# Patient Record
Sex: Female | Born: 1990 | Race: White | Hispanic: No | Marital: Married | Smoking: Never smoker
Health system: Southern US, Community
[De-identification: ages and names within clinical notes are randomized; demographics above are authoritative.]

## PROBLEM LIST (undated history)

## (undated) ENCOUNTER — Inpatient Hospital Stay (HOSPITAL_COMMUNITY): Payer: Self-pay

## (undated) DIAGNOSIS — G5603 Carpal tunnel syndrome, bilateral upper limbs: Secondary | ICD-10-CM

## (undated) DIAGNOSIS — F419 Anxiety disorder, unspecified: Secondary | ICD-10-CM

## (undated) DIAGNOSIS — G5600 Carpal tunnel syndrome, unspecified upper limb: Secondary | ICD-10-CM

## (undated) DIAGNOSIS — K219 Gastro-esophageal reflux disease without esophagitis: Secondary | ICD-10-CM

## (undated) HISTORY — DX: Anxiety disorder, unspecified: F41.9

## (undated) HISTORY — PX: NO PAST SURGERIES: SHX2092

## (undated) HISTORY — DX: Carpal tunnel syndrome, unspecified upper limb: G56.00

---
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
## 2009-02-23 IMAGING — CR DG ANKLE COMPLETE 3+V*L*
3 series · 3 of 3 positions shown · non-contrast
Comparison: none

CLINICAL DATA: Left ankle injury with pain and swelling.
 LEFT ANKLE ? 3 VIEW:

[t ankle joint ap left]
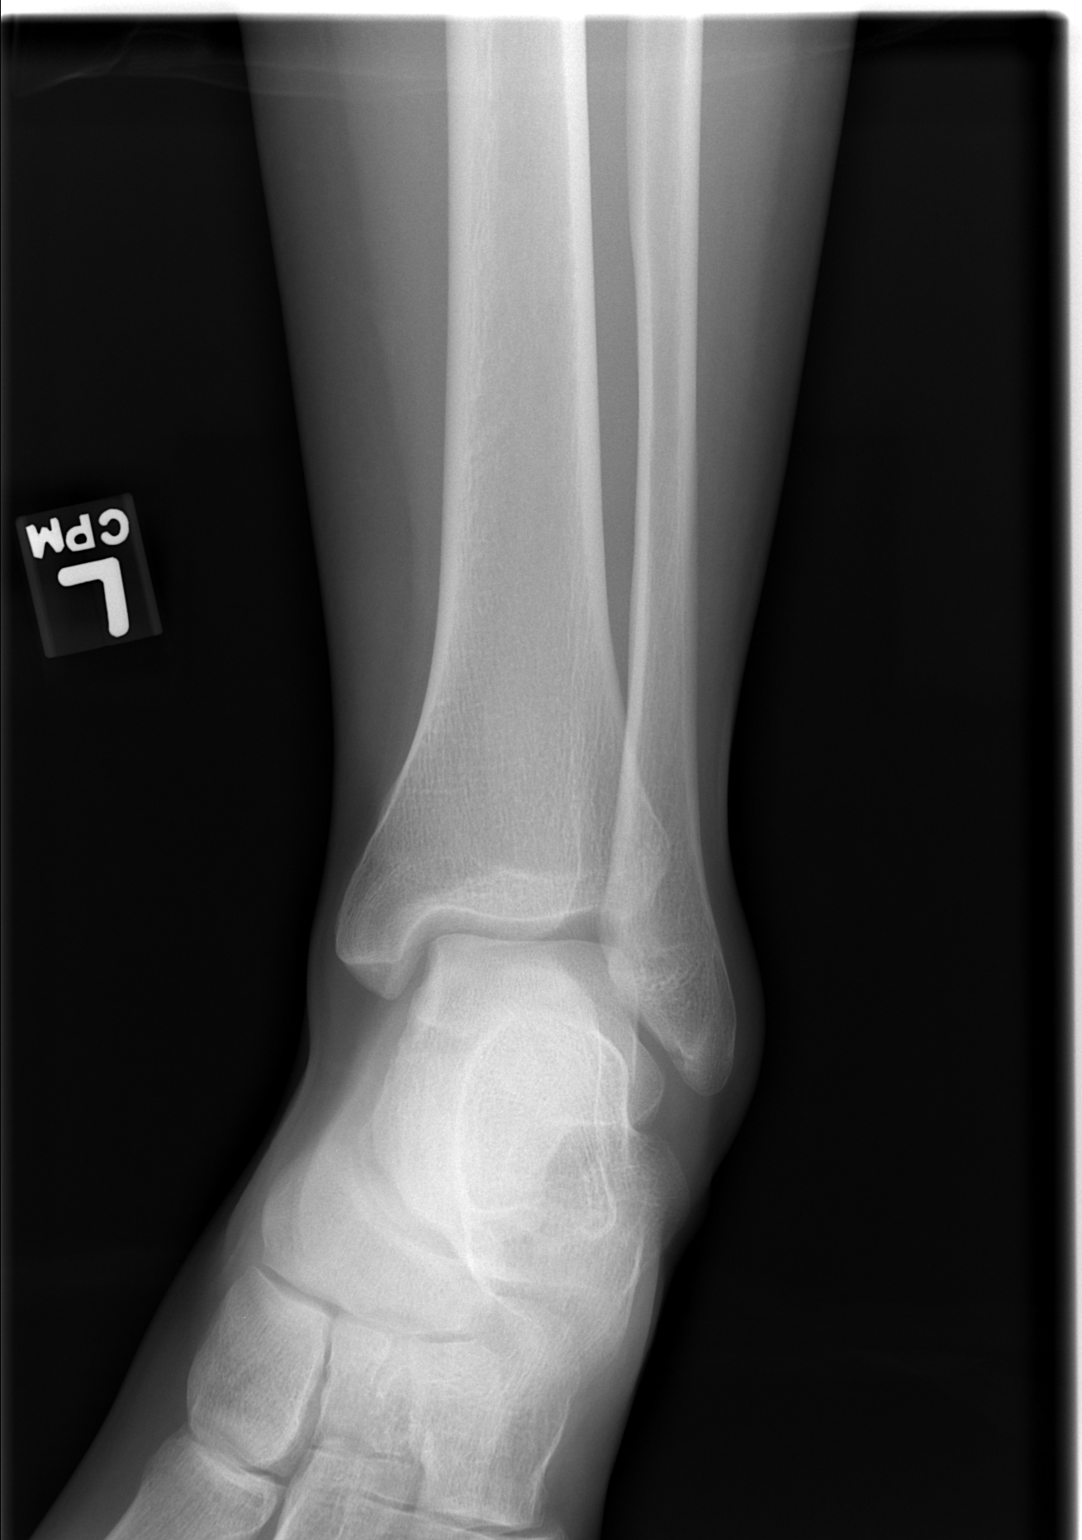

[t ankle joint oblique left]
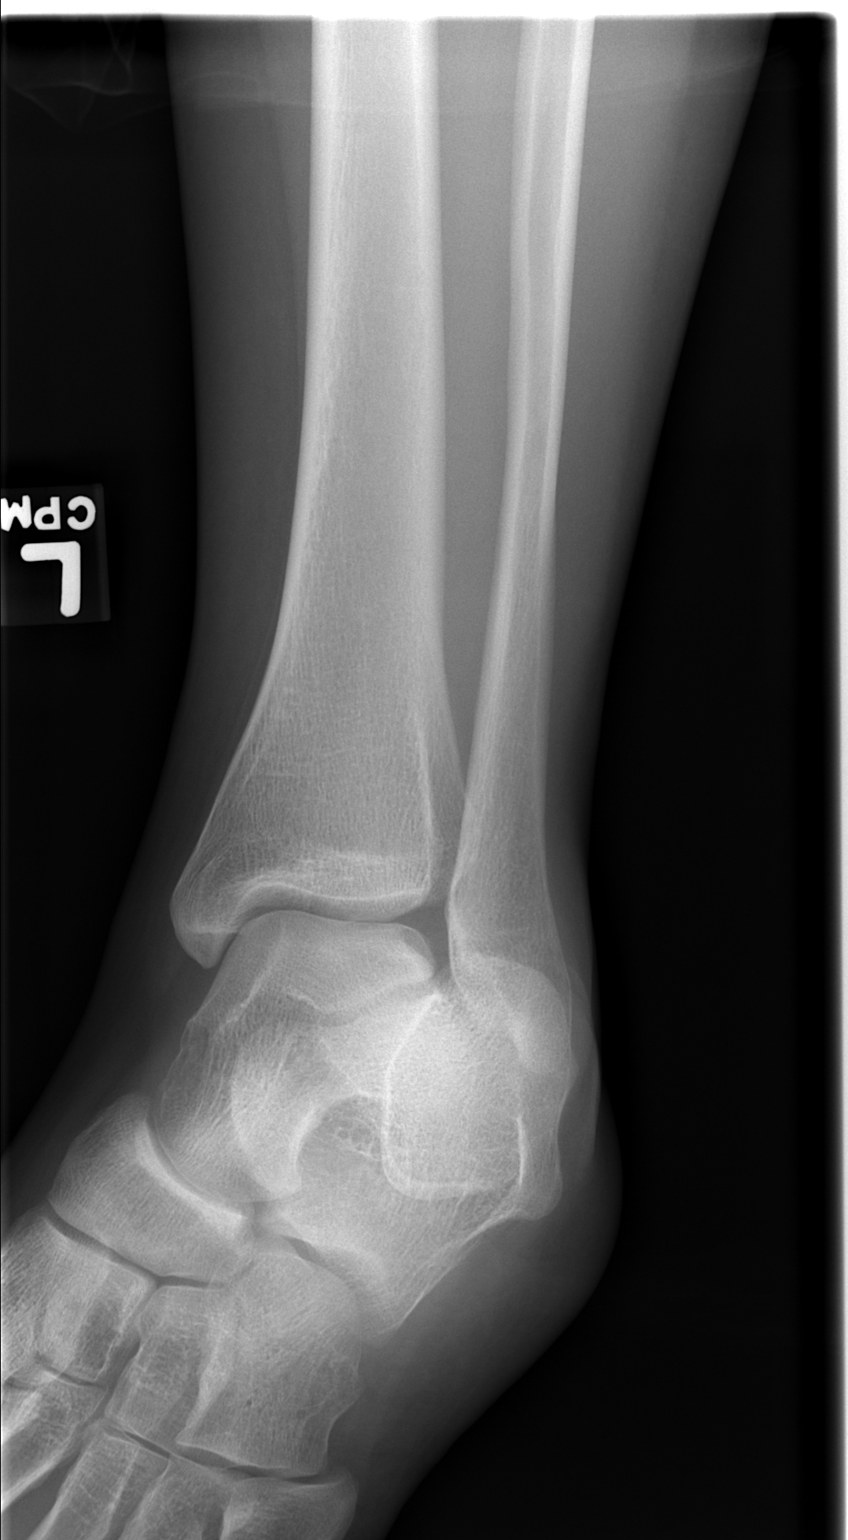

[t ankle joint lat left]
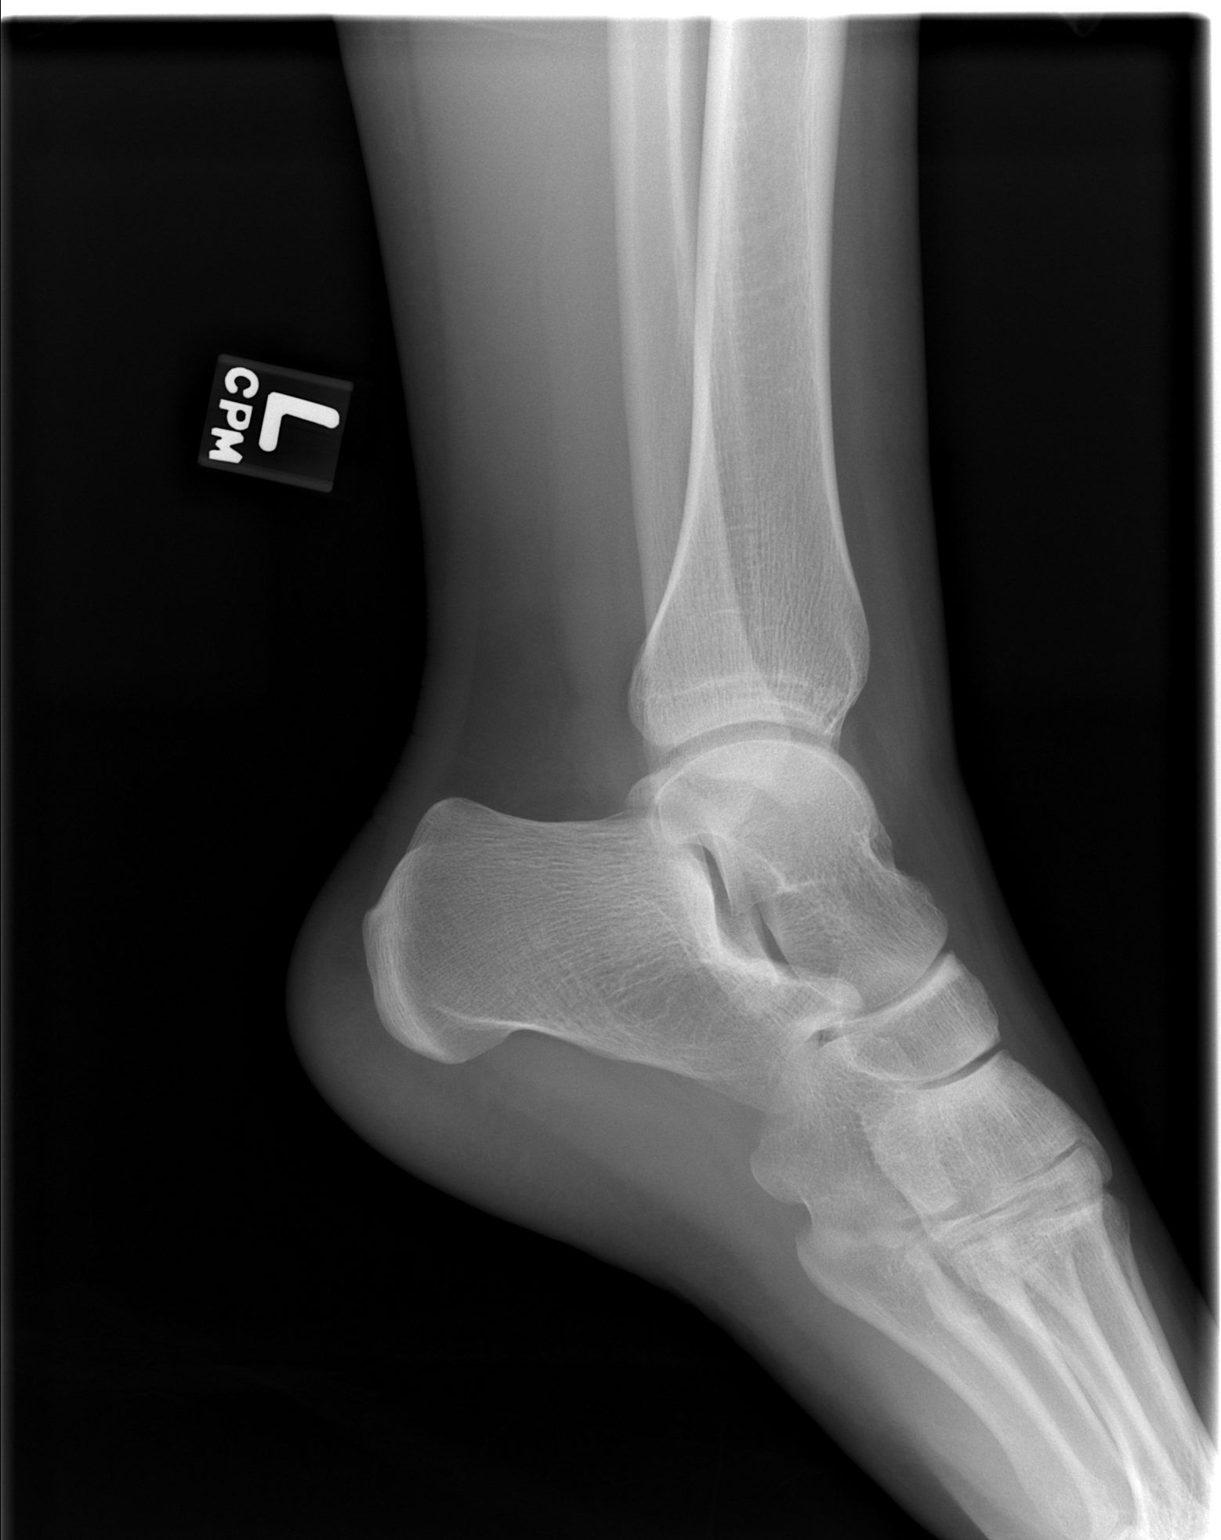

[3 of 3 positions shown; findings below may reference images not displayed]

FINDINGS: There is a nondisplaced fracture of the distal fibular tip.  No evidence of subluxation or dislocation.  Ankle mortise is intact.  Small ankle effusion is present.
IMPRESSION: Nondisplaced fibular tip fracture.

## 2009-03-28 ENCOUNTER — Emergency Department (HOSPITAL_COMMUNITY): Admission: EM | Admit: 2009-03-28 | Discharge: 2009-03-29 | Payer: Self-pay | Admitting: Emergency Medicine

## 2011-11-11 ENCOUNTER — Ambulatory Visit (INDEPENDENT_AMBULATORY_CARE_PROVIDER_SITE_OTHER): Payer: BC Managed Care – PPO | Admitting: Family Medicine

## 2011-11-11 VITALS — BP 132/74 | HR 68 | Temp 98.2°F | Resp 18 | Ht 68.0 in | Wt 154.8 lb

## 2011-11-11 DIAGNOSIS — J329 Chronic sinusitis, unspecified: Secondary | ICD-10-CM

## 2011-11-11 DIAGNOSIS — J019 Acute sinusitis, unspecified: Secondary | ICD-10-CM

## 2011-11-11 DIAGNOSIS — R059 Cough, unspecified: Secondary | ICD-10-CM

## 2011-11-11 DIAGNOSIS — R05 Cough: Secondary | ICD-10-CM

## 2011-11-11 MED ORDER — HYDROCODONE-HOMATROPINE 5-1.5 MG/5ML PO SYRP: 5.0000 mL | ORAL_SOLUTION | Freq: Four times a day (QID) | ORAL | Status: AC | PRN

## 2011-11-11 MED ORDER — AMOXICILLIN-POT CLAVULANATE 875-125 MG PO TABS: 1.0000 | ORAL_TABLET | Freq: Two times a day (BID) | ORAL | Status: AC

## 2011-11-11 NOTE — Progress Notes (Signed)
  Subjective:    Patient ID: Kaitlyn Nguyen, female    DOB: 07-18-1991, 21 y.o.   MRN: [id number]  Cough This is a new problem. The current episode started 1 to 4 weeks ago. The problem has been gradually worsening. The problem occurs constantly. The cough is productive of purulent sputum, productive of sputum and productive of bloody sputum. Associated symptoms include hemoptysis, nasal congestion, postnasal drip and a sore throat. The symptoms are aggravated by nothing. Risk factors for lung disease include occupational exposure. She has tried OTC cough suppressant for the symptoms. The treatment provided no relief. There is no history of asthma, COPD or emphysema.  Sinusitis Associated symptoms include coughing and a sore throat.    Taking Loestrin 24FE Review of Systems  HENT: Positive for sore throat and postnasal drip.   Respiratory: Positive for cough and hemoptysis.        Objective:   Physical Exam  Constitutional: She is oriented to person, place, and time. She appears well-developed and well-nourished.  HENT:  Head: Normocephalic and atraumatic.  Right Ear: External ear normal.  Left Ear: External ear normal.  Mouth/Throat: No oropharyngeal exudate (throat is reddened).  Eyes: Conjunctivae and EOM are normal. Pupils are equal, round, and reactive to light.  Neck: Normal range of motion. Neck supple. No thyromegaly present.  Cardiovascular: Normal rate, regular rhythm, normal heart sounds and intact distal pulses.   Pulmonary/Chest: Wheezes: bilateral ronchi.  Lymphadenopathy:    She has no cervical adenopathy.  Neurological: She is alert and oriented to person, place, and time.  Skin: Skin is warm and dry.          Assessment & Plan:  Acute sinusitis with cough/bronchitis

## 2013-09-06 ENCOUNTER — Encounter: Payer: Self-pay | Admitting: Certified Nurse Midwife

## 2013-09-09 ENCOUNTER — Ambulatory Visit (INDEPENDENT_AMBULATORY_CARE_PROVIDER_SITE_OTHER): Payer: BC Managed Care – PPO | Admitting: Certified Nurse Midwife

## 2013-09-09 ENCOUNTER — Encounter: Payer: Self-pay | Admitting: Certified Nurse Midwife

## 2013-09-09 VITALS — BP 110/78 | HR 72 | Resp 16 | Ht 68.25 in | Wt 150.0 lb

## 2013-09-09 DIAGNOSIS — Z309 Encounter for contraceptive management, unspecified: Secondary | ICD-10-CM

## 2013-09-09 DIAGNOSIS — Z01419 Encounter for gynecological examination (general) (routine) without abnormal findings: Secondary | ICD-10-CM

## 2013-09-09 DIAGNOSIS — N912 Amenorrhea, unspecified: Secondary | ICD-10-CM

## 2013-09-09 LAB — POCT URINE PREGNANCY: Preg Test, Ur: NEGATIVE

## 2013-09-09 MED ORDER — NORETHIN ACE-ETH ESTRAD-FE 1-20 MG-MCG(24) PO CHEW: 1.0000 mg | CHEWABLE_TABLET | Freq: Every day | ORAL | Status: DC

## 2013-09-09 NOTE — Progress Notes (Signed)
22 y.o. G0P0000 Married Caucasian Fe here for annual exam. Periods usually scant with OCP. Happy with choice. Sees PCP prn, treated for URI this year only. No health issues today.  Patient's last menstrual period was 07/03/2012.          Sexually active: yes  The current method of family planning is OCP (estrogen/progesterone).    Exercising: yes  dance Smoker:  no  Health Maintenance: Pap:  07-18-11 neg MMG:  none Colonoscopy:  none BMD:   none TDaP:  2008 Labs: UPT-neg Self breast exam: done monthly   reports that she has never smoked. She does not have any smokeless tobacco history on file. She reports that she drinks alcohol. She reports that she does not use illicit drugs.  Past Medical History  Diagnosis Date  . Carpal tunnel syndrome     History reviewed. No pertinent past surgical history.  Current Outpatient Prescriptions  Medication Sig Dispense Refill  . Norethin Ace-Eth Estrad-FE (MINASTRIN 24 FE) 1-20 MG-MCG(24) CHEW Chew by mouth daily.       No current facility-administered medications for this visit.    Family History  Problem Relation Age of Onset  . Hypertension Father   . Diabetes Maternal Grandfather   . Heart attack Maternal Grandfather   . Heart attack Paternal Grandfather     ROS:  Pertinent items are noted in HPI.  Otherwise, a comprehensive ROS was negative.  Exam:   BP 110/78  Pulse 72  Resp 16  Ht 5' 8.25" (1.734 m)  Wt 150 lb (68.04 kg)  BMI 22.63 kg/m2  LMP 07/03/2012 Height: 5' 8.25" (173.4 cm)  Ht Readings from Last 3 Encounters:  09/09/13 5' 8.25" (1.734 m)  11/11/11 5\' 8"  (1.727 m)    General appearance: alert, cooperative and appears stated age Head: Normocephalic, without obvious abnormality, atraumatic Neck: no adenopathy, supple, symmetrical, trachea midline and thyroid normal to inspection and palpation and non-palpable Lungs: clear to auscultation bilaterally Breasts: normal appearance, no masses or tenderness, No  nipple retraction or dimpling, No nipple discharge or bleeding, No axillary or supraclavicular adenopathy Heart: regular rate and rhythm Abdomen: soft, non-tender; no masses,  no organomegaly Extremities: extremities normal, atraumatic, no cyanosis or edema Skin: Skin color, texture, turgor normal. No rashes or lesions Lymph nodes: Cervical, supraclavicular, and axillary nodes normal. No abnormal inguinal nodes palpated Neurologic: Grossly normal   Pelvic: External genitalia:  no lesions              Urethra:  normal appearing urethra with no masses, tenderness or lesions              Bartholin's and Skene's: normal                 Vagina: normal appearing vagina with normal color and discharge, no lesions              Cervix: normal, non tender              Pap taken: no Bimanual Exam:  Uterus:  normal size, contour, position, consistency, mobility, non-tender and anteverted              Adnexa: normal adnexa and no mass, fullness, tenderness               Rectovaginal: Confirms               Anus: deferred  A:  Well Woman with normal exam  Contraception OCP desired  P:   Reviewed  health and wellness pertinent to exam  Rx Minastrin 24 Fe see order  Pap smear as per guidelines   pap smear not taken today  counseled on use and side effects of OCP's, adequate intake of calcium and vitamin D, diet and exercise  return annually or prn  An After Visit Summary was printed and given to the patient.

## 2013-09-09 NOTE — Patient Instructions (Signed)
General topics  Next pap or exam is  due in 1 year Take a Women's multivitamin Take 1200 mg. of calcium daily - prefer dietary If any concerns in interim to call back  Breast Self-Awareness Practicing breast self-awareness may pick up problems early, prevent significant medical complications, and possibly save your life. By practicing breast self-awareness, you can become familiar with how your breasts look and feel and if your breasts are changing. This allows you to notice changes early. It can also offer you some reassurance that your breast health is good. One way to learn what is normal for your breasts and whether your breasts are changing is to do a breast self-exam. If you find a lump or something that was not present in the past, it is best to contact your caregiver right away. Other findings that should be evaluated by your caregiver include nipple discharge, especially if it is bloody; skin changes or reddening; areas where the skin seems to be pulled in (retracted); or new lumps and bumps. Breast pain is seldom associated with cancer (malignancy), but should also be evaluated by a caregiver. BREAST SELF-EXAM The best time to examine your breasts is 5 7 days after your menstrual period is over.  ExitCare Patient Information 2013 [employer].   Exercise to Stay Healthy Exercise helps you become and stay healthy. EXERCISE IDEAS AND TIPS Choose exercises that:  You enjoy.  Fit into your day. You do not need to exercise really hard to be healthy. You can do exercises at a slow or medium level and stay healthy. You can:  Stretch before and after working out.  Try yoga, Pilates, or tai chi.  Lift weights.  Walk fast, swim, jog, run, climb stairs, bicycle, dance, or rollerskate.  Take aerobic classes. Exercises that burn about 150 calories:  Running 1  miles in 15 minutes.  Playing volleyball for 45 to 60 minutes.  Washing and waxing a car for 45 to 60  minutes.  Playing touch football for 45 minutes.  Walking 1  miles in 35 minutes.  Pushing a stroller 1  miles in 30 minutes.  Playing basketball for 30 minutes.  Raking leaves for 30 minutes.  Bicycling 5 miles in 30 minutes.  Walking 2 miles in 30 minutes.  Dancing for 30 minutes.  Shoveling snow for 15 minutes.  Swimming laps for 20 minutes.  Walking up stairs for 15 minutes.  Bicycling 4 miles in 15 minutes.  Gardening for 30 to 45 minutes.  Jumping rope for 15 minutes.  Washing windows or floors for 45 to 60 minutes. Document Released: 10/22/2010 Document Revised: 12/12/2011 Document Reviewed: 10/22/2010 Exit[employer] Patient Information 2013 [employer].   Other topics ( that may be useful information):    Sexually Transmitted Disease Sexually transmitted disease (STD) refers to any infection that is passed from person to person during sexual activity. This may happen by way of saliva, semen, blood, vaginal mucus, or urine. Common STDs include:  Gonorrhea.  Chlamydia.  Syphilis.  HIV/AIDS.  Genital herpes.  Hepatitis B and C.  Trichomonas.  Human papillomavirus (HPV).  Pubic lice. CAUSES  An STD may be spread by bacteria, virus, or parasite. A person can get an STD by:  Sexual intercourse with an infected person.  Sharing sex toys with an infected person.  Sharing needles with an infected person.  Having intimate contact with the genitals, mouth, or rectal areas of an infected person. SYMPTOMS  Some people may not have any symptoms, but   they can still pass the infection to others. Different STDs have different symptoms. Symptoms include:  Painful or bloody urination.  Pain in the pelvis, abdomen, vagina, anus, throat, or eyes.  Skin rash, itching, irritation, growths, or sores (lesions). These usually occur in the genital or anal area.  Abnormal vaginal discharge.  Penile discharge in men.  Soft, flesh-colored skin growths in the  genital or anal area.  Fever.  Pain or bleeding during sexual intercourse.  Swollen glands in the groin area.  Yellow skin and eyes (jaundice). This is seen with hepatitis. DIAGNOSIS  To make a diagnosis, your caregiver may:  Take a medical history.  Perform a physical exam.  Take a specimen (culture) to be examined.  Examine a sample of discharge under a microscope.  Perform blood test TREATMENT   Chlamydia, gonorrhea, trichomonas, and syphilis can be cured with antibiotic medicine.  Genital herpes, hepatitis, and HIV can be treated, but not cured, with prescribed medicines. The medicines will lessen the symptoms.  Genital warts from HPV can be treated with medicine or by freezing, burning (electrocautery), or surgery. Warts may come back.  HPV is a virus and cannot be cured with medicine or surgery.However, abnormal areas may be followed very closely by your caregiver and may be removed from the cervix, vagina, or vulva through office procedures or surgery. If your diagnosis is confirmed, your recent sexual partners need treatment. This is true even if they are symptom-free or have a negative culture or evaluation. They should not have sex until their caregiver says it is okay. HOME CARE INSTRUCTIONS  All sexual partners should be informed, tested, and treated for all STDs.  Take your antibiotics as directed. Finish them even if you start to feel better.  Only take over-the-counter or prescription medicines for pain, discomfort, or fever as directed by your caregiver.  Rest.  Eat a balanced diet and drink enough fluids to keep your urine clear or pale yellow.  Do not have sex until treatment is completed and you have followed up with your caregiver. STDs should be checked after treatment.  Keep all follow-up appointments, Pap tests, and blood tests as directed by your caregiver.  Only use latex condoms and water-soluble lubricants during sexual activity. Do not use  petroleum jelly or oils.  Avoid alcohol and illegal drugs.  Get vaccinated for HPV and hepatitis. If you have not received these vaccines in the past, talk to your caregiver about whether one or both might be right for you.  Avoid risky sex practices that can break the skin. The only way to avoid getting an STD is to avoid all sexual activity.Latex condoms and dental dams (for oral sex) will help lessen the risk of getting an STD, but will not completely eliminate the risk. SEEK MEDICAL CARE IF:   You have a fever.  You have any new or worsening symptoms. Document Released: 12/10/2002 Document Revised: 12/12/2011 Document Reviewed: 12/17/2010 ExitCare Patient Information 2013 [employer].    Domestic Abuse You are being battered or abused if someone close to you hits, pushes, or physically hurts you in any way. You also are being abused if you are forced into activities. You are being sexually abused if you are forced to have sexual contact of any kind. You are being emotionally abused if you are made to feel worthless or if you are constantly threatened. It is important to remember that help is available. No one has the right to abuse you. PREVENTION OF FURTHER   ABUSE  Learn the warning signs of danger. This varies with situations but may include: the use of alcohol, threats, isolation from friends and family, or forced sexual contact. Leave if you feel that violence is going to occur.  If you are attacked or beaten, report it to the police so the abuse is documented. You do not have to press charges. The police can protect you while you or the attackers are leaving. Get the officer's name and badge number and a copy of the report.  Find someone you can trust and tell them what is happening to you: your caregiver, a nurse, clergy member, close friend or family member. Feeling ashamed is natural, but remember that you have done nothing wrong. No one deserves abuse. Document Released:  09/16/2000 Document Revised: 12/12/2011 Document Reviewed: 11/25/2010 Exit[employer] Patient Information 2013 [employer].    How Much is Too Much Alcohol? Drinking too much alcohol can cause injury, accidents, and health problems. These types of problems can include:   Car crashes.  Falls.  Family fighting (domestic violence).  Drowning.  Fights.  Injuries.  Burns.  Damage to certain organs.  Having a baby with birth defects. ONE DRINK CAN BE TOO MUCH WHEN YOU ARE:  Working.  Pregnant or breastfeeding.  Taking medicines. Ask your doctor.  Driving or planning to drive. If you or someone you know has a drinking problem, get help from a doctor.  Document Released: 07/16/2009 Document Revised: 12/12/2011 Document Reviewed: 07/16/2009 ExitCare Patient Information 2013 [employer].   Smoking Hazards Smoking cigarettes is extremely bad for your health. Tobacco smoke has over 200 known poisons in it. There are over 60 chemicals in tobacco smoke that cause cancer. Some of the chemicals found in cigarette smoke include:   Cyanide.  Benzene.  Formaldehyde.  Methanol (wood alcohol).  Acetylene (fuel used in welding torches).  Ammonia. Cigarette smoke also contains the poisonous gases nitrogen oxide and carbon monoxide.  Cigarette smokers have an increased risk of many serious medical problems and Smoking causes approximately:  90% of all lung cancer deaths in men.  80% of all lung cancer deaths in women.  90% of deaths from chronic obstructive lung disease. Compared with nonsmokers, smoking increases the risk of:  Coronary heart disease by 2 to 4 times.  Stroke by 2 to 4 times.  Men developing lung cancer by 23 times.  Women developing lung cancer by 13 times.  Dying from chronic obstructive lung diseases by 12 times.  . Smoking is the most preventable cause of death and disease in our society.  WHY IS SMOKING ADDICTIVE?  Nicotine is the chemical  agent in tobacco that is capable of causing addiction or dependence.  When you smoke and inhale, nicotine is absorbed rapidly into the bloodstream through your lungs. Nicotine absorbed through the lungs is capable of creating a powerful addiction. Both inhaled and non-inhaled nicotine may be addictive.  Addiction studies of cigarettes and spit tobacco show that addiction to nicotine occurs mainly during the teen years, when young people begin using tobacco products. WHAT ARE THE BENEFITS OF QUITTING?  There are many health benefits to quitting smoking.   Likelihood of developing cancer and heart disease decreases. Health improvements are seen almost immediately.  Blood pressure, pulse rate, and breathing patterns start returning to normal soon after quitting. QUITTING SMOKING   American Lung Association - 1-800-LUNGUSA  American Cancer Society - 1-800-ACS-2345 Document Released: 10/27/2004 Document Revised: 12/12/2011 Document Reviewed: 07/01/2009 ExitCare Patient Information 2013 [employer].   Stress Management Stress is a state of physical or mental tension that often results from changes in your life or normal routine. Some common causes of stress are:  Death of a loved one.  Injuries or severe illnesses.  Getting fired or changing jobs.  Moving into a new home. Other causes may be:  Sexual problems.  Business or financial losses.  Taking on a large debt.  Regular conflict with someone at home or at work.  Constant tiredness from lack of sleep. It is not just bad things that are stressful. It may be stressful to:  Win the lottery.  Get married.  Buy a new car. The amount of stress that can be easily tolerated varies from person to person. Changes generally cause stress, regardless of the types of change. Too much stress can affect your health. It may lead to physical or emotional problems. Too little stress (boredom) may also become stressful. SUGGESTIONS TO  REDUCE STRESS:  Talk things over with your family and friends. It often is helpful to share your concerns and worries. If you feel your problem is serious, you may want to get help from a professional counselor.  Consider your problems one at a time instead of lumping them all together. Trying to take care of everything at once may seem impossible. List all the things you need to do and then start with the most important one. Set a goal to accomplish 2 or 3 things each day. If you expect to do too many in a single day you will naturally fail, causing you to feel even more stressed.  Do not use alcohol or drugs to relieve stress. Although you may feel better for a short time, they do not remove the problems that caused the stress. They can also be habit forming.  Exercise regularly - at least 3 times per week. Physical exercise can help to relieve that "uptight" feeling and will relax you.  The shortest distance between despair and hope is often a good night's sleep.  Go to bed and get up on time allowing yourself time for appointments without being rushed.  Take a short "time-out" period from any stressful situation that occurs during the day. Close your eyes and take some deep breaths. Starting with the muscles in your face, tense them, hold it for a few seconds, then relax. Repeat this with the muscles in your neck, shoulders, hand, stomach, back and legs.  Take good care of yourself. Eat a balanced diet and get plenty of rest.  Schedule time for having fun. Take a break from your daily routine to relax. HOME CARE INSTRUCTIONS   Call if you feel overwhelmed by your problems and feel you can no longer manage them on your own.  Return immediately if you feel like hurting yourself or someone else. Document Released: 03/15/2001 Document Revised: 12/12/2011 Document Reviewed: 11/05/2007 ExitCare Patient Information 2013 [employer].   

## 2013-09-10 NOTE — Progress Notes (Signed)
Note reviewed, agree with plan.  Mardell Cragg, MD  

## 2013-11-11 ENCOUNTER — Telehealth: Payer: Self-pay | Admitting: Certified Nurse Midwife

## 2013-11-11 ENCOUNTER — Ambulatory Visit (INDEPENDENT_AMBULATORY_CARE_PROVIDER_SITE_OTHER): Payer: BC Managed Care – PPO | Admitting: Certified Nurse Midwife

## 2013-11-11 ENCOUNTER — Encounter: Payer: Self-pay | Admitting: Certified Nurse Midwife

## 2013-11-11 VITALS — BP 114/76 | HR 68 | Resp 16 | Ht 68.25 in

## 2013-11-11 DIAGNOSIS — N920 Excessive and frequent menstruation with regular cycle: Secondary | ICD-10-CM

## 2013-11-11 LAB — CBC
HCT: 42.2 % (ref 36.0–46.0)
HEMOGLOBIN: 14.8 g/dL (ref 12.0–15.0)
MCH: 31.2 pg (ref 26.0–34.0)
MCHC: 35.1 g/dL (ref 30.0–36.0)
MCV: 88.8 fL (ref 78.0–100.0)
Platelets: 187 10*3/uL (ref 150–400)
RBC: 4.75 MIL/uL (ref 3.87–5.11)
RDW: 12.5 % (ref 11.5–15.5)
WBC: 6 10*3/uL (ref 4.0–10.5)

## 2013-11-11 LAB — POCT URINE PREGNANCY: PREG TEST UR: NEGATIVE

## 2013-11-11 NOTE — Telephone Encounter (Signed)
Spoke with patient. She has had very minimal cycling on Minastrin. She is currently on inactive pills. Started period on Saturday, No bleeding Sunday and today she states bleeding is very heavy and changing super tampon q two hours. She states that it has many clots as well. Has not missed pills. Advised patient to call back or seek immediate medical care if bleeding worsens or soaking through 1 pad/tampon per hour.  Patient verbalized understanding. Office visit scheduled with Verner Choleborah S. Leonard CNM for today at 2:00.

## 2013-11-11 NOTE — Telephone Encounter (Signed)
Patient is calling because the bc she is on she doesn't have a period but this time she got one and it is super heavy with lots of clots. Going through super tampons every hour. Started Saturday. Lightened up yesterday but started again this morning at 4 am. hasnt had a cycle in years

## 2013-11-11 NOTE — Patient Instructions (Signed)
Menorrhagia  Menorrhagia is the medical term for when your menstrual periods are heavy or last longer than usual. With menorrhagia, every period you have may cause enough blood loss and cramping that you are unable to maintain your usual activities.  CAUSES   In some cases, the cause of heavy periods is unknown, but a number of conditions may cause menorrhagia. Common causes include:  · A problem with the hormone-producing thyroid gland (hypothyroid).  · Noncancerous growths in the uterus (polyps or fibroids).  · An imbalance of the estrogen and progesterone hormones.  · One of your ovaries not releasing an egg during one or more months.  · Side effects of having an intrauterine device (IUD).  · Side effects of some medicines, such as anti-inflammatory medicines or blood thinners.  · A bleeding disorder that stops your blood from clotting normally.  SIGNS AND SYMPTOMS   During a normal period, bleeding lasts between 4 and 8 days. Signs that your periods are too heavy include:  · You routinely have to change your pad or tampon every 1 or 2 hours because it is completely soaked.  · You pass blood clots larger than 1 inch (2.5 cm) in size.  · You have bleeding for more than 7 days.  · You need to use pads and tampons at the same time because of heavy bleeding.  · You need to wake up to change your pads or tampons during the night.  · You have symptoms of anemia, such as tiredness, fatigue, or shortness of breath.   DIAGNOSIS   Your health care provider will perform a physical exam and ask you questions about your symptoms and menstrual history. Other tests may be ordered based on what the health care provider finds during the exam. These tests can include:  · Blood tests To check if you are pregnant or have hormonal changes, a bleeding or thyroid disorder, low iron levels (anemia), or other problems.  · Endometrial biopsy Your health care provider takes a sample of tissue from the inside of your uterus to be examined  under a microscope.  · Pelvic ultrasound This test uses sound waves to make a picture of your uterus, ovaries, and vagina. The pictures can show if you have fibroids or other growths.  · Hysteroscopy For this test, your health care provider will use a small telescope to look inside your uterus.  Based on the results of your initial tests, your health care provider may recommend further testing.  TREATMENT   Treatment may not be needed. If it is needed, your health care provider may recommend treatment with one or more medicines first. If these do not reduce bleeding enough, a surgical treatment might be an option. The best treatment for you will depend on:   · Whether you need to prevent pregnancy.    · Your desire to have children in the future.  · The cause and severity of your bleeding.  · Your opinion and personal preference.    Medicines for menorrhagia may include:  · Birth control methods that use hormones These include the pill, skin patch, vaginal ring, shots that you get every 3 months, hormonal IUD, and implant. These treatments reduce bleeding during your menstrual period.  · Medicines that thicken blood and slow bleeding.  · Medicines that reduce swelling, such as ibuprofen.   · Medicines that contain a synthetic hormone called progestin.    · Medicines that make the ovaries stop working for a short time.    You may need surgical   treatment for menorrhagia if the medicines are unsuccessful. Treatment options include:  · Dilation and curettage (D&C) In this procedure, your health care provider opens (dilates) your cervix and then scrapes or suctions tissue from the lining of your uterus to reduce menstrual bleeding.  · Operative hysteroscopy This procedure uses a tiny tube with a light (hysteroscope) to view your uterine cavity and can help in the surgical removal of a polyp that may be causing heavy periods.  · Endometrial ablation Through various techniques, your health care provider permanently  destroys the entire lining of your uterus (endometrium). After endometrial ablation, most women have little or no menstrual flow. Endometrial ablation reduces your ability to become pregnant.  · Endometrial resection This surgical procedure uses an electrosurgical wire loop to remove the lining of the uterus. This procedure also reduces your ability to become pregnant.  · Hysterectomy Surgical removal of the uterus and cervix is a permanent procedure that stops menstrual periods. Pregnancy is not possible after a hysterectomy. This procedure requires anesthesia and hospitalization.  HOME CARE INSTRUCTIONS   · Only take over-the-counter or prescription medicines as directed by your health care provider. Take prescribed medicines exactly as directed. Do not change or switch medicines without consulting your health care provider.  · Take any prescribed iron pills exactly as directed by your health care provider. Long-term heavy bleeding may result in low iron levels. Iron pills help replace the iron your body lost from heavy bleeding. Iron may cause constipation. If this becomes a problem, increase the bran, fruits, and roughage in your diet.  · Do not take aspirin or medicines that contain aspirin 1 week before or during your menstrual period. Aspirin may make the bleeding worse.  · If you need to change your sanitary pad or tampon more than once every 2 hours, stay in bed and rest as much as possible until the bleeding stops.  · Eat well-balanced meals. Eat foods high in iron. Examples are leafy green vegetables, meat, liver, eggs, and whole grain breads and cereals. Do not try to lose weight until the abnormal bleeding has stopped and your blood iron level is back to normal.  SEEK MEDICAL CARE IF:   · You soak through a pad or tampon every 1 or 2 hours, and this happens every time you have a period.  · You need to use pads and tampons at the same time because you are bleeding so much.  · You need to change your pad  or tampon during the night.  · You have a period that lasts for more than 8 days.  · You pass clots bigger than 1 inch wide.  · You have irregular periods that happen more or less often than once a month.  · You feel dizzy or faint.  · You feel very weak or tired.  · You feel short of breath or feel your heart is beating too fast when you exercise.  · You have nausea and vomiting or diarrhea while you are taking your medicine.  · You have any problems that may be related to the medicine you are taking.  SEEK IMMEDIATE MEDICAL CARE IF:   · You soak through 4 or more pads or tampons in 2 hours.  · You have any bleeding while you are pregnant.  MAKE SURE YOU:   · Understand these instructions.  · Will watch your condition.  · Will get help right away if you are not doing well or get worse.    Document Released: 09/19/2005 Document Revised: 07/10/2013 Document Reviewed: 03/10/2013  [employer]® Patient Information ©2014 [employer].

## 2013-11-11 NOTE — Progress Notes (Signed)
23 y.o. Married Caucasian female G0P0000 with LMP 11/09/13 here complaining of heavy bleeding and small clots at times,  with this period.Contraception is Minastrin 24.    Using contraception method as prescribed.  Denies inconsistent use.  No new medications. Patient periods are usually light on OCP and this was heavier so patient was worried. Denies dizziness, nausea, or headache. Worked today without problems. Patient has used 4 tampons and pad together in the past 8 hours. No other health issues.    O: Healthy WN WD female, no apparent distress  Alert oriented x3 Skin:warm and dry, color good Thyroid:normal to inspection and palpation Abdomen: non tender Pelvic exam: External genital area : no lesions normal Vagina: normal with small amount of red blood noted in vaginal vault, non tender Cervix: non tender, small blood clot noted in cervix, removed, small amount bleeding noted from cervix Uterus: small, non tender,normal  Adnexa: normal, no masses, non tender  A:Normal Pelvic exam 2-?Menorrhagia with one cycle 3-Contraception consistent use of Minastrin 24 4- Negative UPT  P:Reviewed findings of normal pelvic exam 2-Discussed non unusual to have heavy period occasionally with scant period history. Discussed thyroid change can effect a change in cycle as well other health events. Patient felt much better after knowing exam was normal. Questions addressed. Warning signs of excessive bleeding given and patient needs to advise if occurs. Continue menses calendar. 3-Continue OCP as directed  Lab: CBC,TSH  Rv prn

## 2013-11-12 LAB — TSH: TSH: 1.526 u[IU]/mL (ref 0.350–4.500)

## 2013-11-15 NOTE — Progress Notes (Signed)
Reviewed personally.  M. Suzanne Ronne Savoia, MD.  

## 2014-07-18 ENCOUNTER — Other Ambulatory Visit: Payer: Self-pay

## 2014-09-15 ENCOUNTER — Ambulatory Visit: Payer: BC Managed Care – PPO | Admitting: Certified Nurse Midwife

## 2014-09-30 ENCOUNTER — Encounter: Payer: Self-pay | Admitting: Certified Nurse Midwife

## 2014-09-30 ENCOUNTER — Ambulatory Visit (INDEPENDENT_AMBULATORY_CARE_PROVIDER_SITE_OTHER): Payer: BC Managed Care – PPO | Admitting: Certified Nurse Midwife

## 2014-09-30 VITALS — BP 120/70 | HR 68 | Resp 16 | Ht 67.75 in | Wt 162.0 lb

## 2014-09-30 DIAGNOSIS — Z0000 Encounter for general adult medical examination without abnormal findings: Secondary | ICD-10-CM

## 2014-09-30 DIAGNOSIS — Z01419 Encounter for gynecological examination (general) (routine) without abnormal findings: Secondary | ICD-10-CM

## 2014-09-30 DIAGNOSIS — Z3041 Encounter for surveillance of contraceptive pills: Secondary | ICD-10-CM

## 2014-09-30 DIAGNOSIS — Z124 Encounter for screening for malignant neoplasm of cervix: Secondary | ICD-10-CM

## 2014-09-30 LAB — POCT URINALYSIS DIPSTICK
Bilirubin, UA: NEGATIVE
GLUCOSE UA: NEGATIVE
Ketones, UA: NEGATIVE
Leukocytes, UA: NEGATIVE
NITRITE UA: NEGATIVE
Protein, UA: NEGATIVE
RBC UA: NEGATIVE
UROBILINOGEN UA: NEGATIVE
pH, UA: 5

## 2014-09-30 MED ORDER — NORETHIN ACE-ETH ESTRAD-FE 1-20 MG-MCG(24) PO CHEW: 1.0000 mg | CHEWABLE_TABLET | Freq: Every day | ORAL | Status: DC

## 2014-09-30 NOTE — Progress Notes (Signed)
23 y.o. G0P0000 Married Caucasian Fe here for annual exam. Periods normal, no issues, may not have no bleeding at times. LMP 05/11/14. Contraception Minastrin working well.  Recent URI with 3 treatments of antibiotics. Feels she has not gotten back normal, since being on antibiotics. Trying to eat well and has started using essential oils in classroom, which has helped.Sees PCP prn. No other health issues today.  Patient's last menstrual period was 05/11/2014.          Sexually active: Yes.    The current method of family planning is OCP (estrogen/progesterone).    Exercising: Yes.    dance teacher Smoker:  no  Health Maintenance: Pap:  07-18-11 neg MMG:  none Colonoscopy:  none BMD:   none TDaP:  2008 Labs: Poct urine-neg Self breast exam: done monthly   reports that she has never smoked. She does not have any smokeless tobacco history on file. She reports that she drinks alcohol. She reports that she does not use illicit drugs.  Past Medical History  Diagnosis Date  . Carpal tunnel syndrome     History reviewed. No pertinent past surgical history.  Current Outpatient Prescriptions  Medication Sig Dispense Refill  . ibuprofen (ADVIL,MOTRIN) 200 MG tablet Take 200 mg by mouth every 6 (six) hours as needed.    . Norethin Ace-Eth Estrad-FE (MINASTRIN 24 FE) 1-20 MG-MCG(24) CHEW Chew 1 mg by mouth daily. 84 tablet 4   No current facility-administered medications for this visit.    Family History  Problem Relation Age of Onset  . Hypertension Father   . Diabetes Maternal Grandfather   . Heart attack Maternal Grandfather   . Heart attack Paternal Grandfather     ROS:  Pertinent items are noted in HPI.  Otherwise, a comprehensive ROS was negative.  Exam:   BP 120/70 mmHg  Pulse 68  Resp 16  Ht 5' 7.75" (1.721 m)  Wt 162 lb (73.483 kg)  BMI 24.81 kg/m2  LMP 05/11/2014 Height: 5' 7.75" (172.1 cm)  Ht Readings from Last 3 Encounters:  09/30/14 5' 7.75" (1.721 m)  11/11/13  5' 8.25" (1.734 m)  09/09/13 5' 8.25" (1.734 m)    General appearance: alert, cooperative and appears stated age Head: Normocephalic, without obvious abnormality, atraumatic Neck: no adenopathy, supple, symmetrical, trachea midline and thyroid normal to inspection and palpation Lungs: clear to auscultation bilaterally Breasts: normal appearance, no masses or tenderness, No nipple retraction or dimpling, No nipple discharge or bleeding, No axillary or supraclavicular adenopathy Heart: regular rate and rhythm Abdomen: soft, non-tender; no masses,  no organomegaly Extremities: extremities normal, atraumatic, no cyanosis or edema Skin: Skin color, texture, turgor normal. No rashes or lesions Lymph nodes: Cervical, supraclavicular, and axillary nodes normal. No abnormal inguinal nodes palpated Neurologic: Grossly normal   Pelvic: External genitalia:  no lesions              Urethra:  normal appearing urethra with no masses, tenderness or lesions              Bartholin's and Skene's: normal                 Vagina: normal appearing vagina with normal color and discharge, no lesions              Cervix: normal, non tender, no lesions              Pap taken: Yes.   Bimanual Exam:  Uterus:  normal size, contour, position, consistency, mobility,  non-tender and anteverted              Adnexa: normal adnexa and no mass, fullness, tenderness               Rectovaginal: Confirms               Anus:  normal appearance  A:  Well Woman with normal exam  Contraception OCP desired  URI with 3 courses of antibiotics, now resolving  P:   Reviewed health and wellness pertinent to exam  Rx Minastrin see order  Pap smear taken today with HPV reflex  Discussed oral refrigerated probiotics to help restore normal GI flora and immune system after antibiotic use. Patient plans to try.   counseled on breast self exam, use and side effects of OCP's, adequate intake of calcium and vitamin D, diet and  exercise  return annually or prn  An After Visit Summary was printed and given to the patient.

## 2014-09-30 NOTE — Patient Instructions (Signed)

## 2014-10-01 NOTE — Progress Notes (Signed)
Reviewed personally.  M. Suzanne Aldora Perman, MD.  

## 2014-10-02 LAB — IPS PAP TEST WITH REFLEX TO HPV

## 2014-11-02 ENCOUNTER — Other Ambulatory Visit: Payer: Self-pay | Admitting: Certified Nurse Midwife

## 2015-07-08 ENCOUNTER — Other Ambulatory Visit: Payer: Self-pay | Admitting: Certified Nurse Midwife

## 2015-07-08 DIAGNOSIS — Z3041 Encounter for surveillance of contraceptive pills: Secondary | ICD-10-CM

## 2015-07-08 MED ORDER — NORETHIN ACE-ETH ESTRAD-FE 1-20 MG-MCG(24) PO CHEW: 1.0000 mg | CHEWABLE_TABLET | Freq: Every day | ORAL | Status: DC

## 2015-07-08 NOTE — Telephone Encounter (Signed)
Medication refill request: Minastrin  Last AEX:  09/30/14 DL Next AEX: 11/91/47 DL Last MMG (if hormonal medication request): None Refill authorized: 09/30/14 #[id number]/ 4 Refills. Today #28/0R?

## 2015-07-08 NOTE — Telephone Encounter (Addendum)
Patient called back. [facility] issues. She still has refills left at [facility]. Will call to cancel.

## 2015-07-08 NOTE — Telephone Encounter (Signed)
Patient is requesting a refill for Minastrin to be sent to [facility]. Annual scheduled for 10/02/15 with DL.

## 2015-07-08 NOTE — Addendum Note (Signed)
Addended by: Dion Body on: 07/08/2015 01:54 PM   Modules accepted: Orders

## 2015-07-08 NOTE — Telephone Encounter (Signed)
Called [facility] to cancel Rx

## 2015-07-08 NOTE — Telephone Encounter (Signed)
Left Voicemail for pt to call back. [facility]. Mrs Kaitlyn Nguyen will only approve 1 month so [facility] may be better for her. I can call to change pharmacies if she prefers [facility].

## 2015-10-02 ENCOUNTER — Ambulatory Visit (INDEPENDENT_AMBULATORY_CARE_PROVIDER_SITE_OTHER): Payer: BLUE CROSS/BLUE SHIELD | Admitting: Certified Nurse Midwife

## 2015-10-02 ENCOUNTER — Encounter: Payer: Self-pay | Admitting: Certified Nurse Midwife

## 2015-10-02 VITALS — BP 120/70 | HR 72 | Resp 16 | Ht 67.75 in | Wt 162.0 lb

## 2015-10-02 DIAGNOSIS — Z01419 Encounter for gynecological examination (general) (routine) without abnormal findings: Secondary | ICD-10-CM

## 2015-10-02 DIAGNOSIS — Z0000 Encounter for general adult medical examination without abnormal findings: Secondary | ICD-10-CM

## 2015-10-02 DIAGNOSIS — Z3041 Encounter for surveillance of contraceptive pills: Secondary | ICD-10-CM | POA: Diagnosis not present

## 2015-10-02 LAB — POCT URINALYSIS DIPSTICK
Bilirubin, UA: NEGATIVE
Glucose, UA: NEGATIVE
Ketones, UA: NEGATIVE
LEUKOCYTES UA: NEGATIVE
NITRITE UA: NEGATIVE
PROTEIN UA: NEGATIVE
RBC UA: NEGATIVE
UROBILINOGEN UA: NEGATIVE
pH, UA: 5

## 2015-10-02 MED ORDER — NORETHIN ACE-ETH ESTRAD-FE 1-20 MG-MCG(24) PO CHEW: 1.0000 mg | CHEWABLE_TABLET | Freq: Every day | ORAL | Status: DC

## 2015-10-02 NOTE — Progress Notes (Signed)
24 y.o. G0P0000 Married  Caucasian Fe here for annual exam. Periods none with OCP, happy with this choice. [facility]. Moved into new house! No health issues today.  No LMP recorded. Patient is not currently having periods (Reason: Oral contraceptives).          Sexually active: Yes.    The current method of family planning is OCP & condoms (estrogen/progesterone).    Exercising: Yes.    eliptical & weights Smoker:  no  Health Maintenance: Pap: 09-30-14 neg MMG:  none Colonoscopy:  none BMD:   none TDaP: 2008 Shingles: no Pneumonia: no Hep C and HIV: unsure declines Labs: poct urine-neg Self breast exam: done monthly   reports that she has never smoked. She does not have any smokeless tobacco history on file. She reports that she drinks about 1.2 oz of alcohol per week. She reports that she does not use illicit drugs.  Past Medical History  Diagnosis Date  . Carpal tunnel syndrome     History reviewed. No pertinent past surgical history.  Current Outpatient Prescriptions  Medication Sig Dispense Refill  . ibuprofen (ADVIL,MOTRIN) 200 MG tablet Take 200 mg by mouth every 6 (six) hours as needed.    . Norethin Ace-Eth Estrad-FE (MINASTRIN 24 FE) 1-20 MG-MCG(24) CHEW Chew 1 mg by mouth daily. 84 tablet 0  . Pseudoephedrine-Guaifenesin (MUCINEX D PO) Take by mouth as needed.     No current facility-administered medications for this visit.    Family History  Problem Relation Age of Onset  . Hypertension Father   . Diabetes Father   . Diabetes Maternal Grandfather   . Heart attack Maternal Grandfather   . Heart attack Paternal Grandfather     ROS:  Pertinent items are noted in HPI.  Otherwise, a comprehensive ROS was negative.  Exam:   BP 120/70 mmHg  Pulse 72  Resp 16  Ht 5' 7.75" (1.721 m)  Wt 162 lb (73.483 kg)  BMI 24.81 kg/m2 Height: 5' 7.75" (172.1 cm) Ht Readings from Last 3 Encounters:  10/02/15 5' 7.75" (1.721 m)  09/30/14 5' 7.75"  (1.721 m)  11/11/13 5' 8.25" (1.734 m)    General appearance: alert, cooperative and appears stated age Head: Normocephalic, without obvious abnormality, atraumatic Neck: no adenopathy, supple, symmetrical, trachea midline and thyroid normal to inspection and palpation Lungs: clear to auscultation bilaterally Breasts: normal appearance, no masses or tenderness, No nipple retraction or dimpling, No nipple discharge or bleeding, No axillary or supraclavicular adenopathy Heart: regular rate and rhythm Abdomen: soft, non-tender; no masses,  no organomegaly Extremities: extremities normal, atraumatic, no cyanosis or edema Skin: Skin color, texture, turgor normal. No rashes or lesions Lymph nodes: Cervical, supraclavicular, and axillary nodes normal. No abnormal inguinal nodes palpated Neurologic: Grossly normal   Pelvic: External genitalia:  no lesions              Urethra:  normal appearing urethra with no masses, tenderness or lesions              Bartholin's and Skene's: normal                 Vagina: normal appearing vagina with normal color and discharge, no lesions              Cervix: normal, non tender, no lesions              Pap taken: No. Bimanual Exam:  Uterus:  normal size, contour, position, consistency, mobility,  non-tender              Adnexa: normal adnexa and no mass, fullness, tenderness               Rectovaginal: Confirms               Anus:  normal appearance  Chaperone present: yes  A:  Well Woman with normal exam  Contraception OCP desired.  P:   Reviewed health and wellness pertinent to exam   Rx Minastrin see order  Pap smear as above not taken   counseled on breast self exam, use and side effects of OCP's, adequate intake of calcium and vitamin D, diet and exercise  return annually or prn  An After Visit Summary was printed and given to the patient.

## 2015-10-02 NOTE — Patient Instructions (Signed)
General topics  Next pap or exam is  due in 1 year Take a Women's multivitamin Take 1200 mg. of calcium daily - prefer dietary If any concerns in interim to call back  Breast Self-Awareness Practicing breast self-awareness may pick up problems early, prevent significant medical complications, and possibly save your life. By practicing breast self-awareness, you can become familiar with how your breasts look and feel and if your breasts are changing. This allows you to notice changes early. It can also offer you some reassurance that your breast health is good. One way to learn what is normal for your breasts and whether your breasts are changing is to do a breast self-exam. If you find a lump or something that was not present in the past, it is best to contact your caregiver right away. Other findings that should be evaluated by your caregiver include nipple discharge, especially if it is bloody; skin changes or reddening; areas where the skin seems to be pulled in (retracted); or new lumps and bumps. Breast pain is seldom associated with cancer (malignancy), but should also be evaluated by a caregiver. BREAST SELF-EXAM The best time to examine your breasts is 5 7 days after your menstrual period is over.  ExitCare Patient Information 2013 [employer].   Exercise to Stay Healthy Exercise helps you become and stay healthy. EXERCISE IDEAS AND TIPS Choose exercises that:  You enjoy.  Fit into your day. You do not need to exercise really hard to be healthy. You can do exercises at a slow or medium level and stay healthy. You can:  Stretch before and after working out.  Try yoga, Pilates, or tai chi.  Lift weights.  Walk fast, swim, jog, run, climb stairs, bicycle, dance, or rollerskate.  Take aerobic classes. Exercises that burn about 150 calories:  Running 1  miles in 15 minutes.  Playing volleyball for 45 to 60 minutes.  Washing and waxing a car for 45 to 60  minutes.  Playing touch football for 45 minutes.  Walking 1  miles in 35 minutes.  Pushing a stroller 1  miles in 30 minutes.  Playing basketball for 30 minutes.  Raking leaves for 30 minutes.  Bicycling 5 miles in 30 minutes.  Walking 2 miles in 30 minutes.  Dancing for 30 minutes.  Shoveling snow for 15 minutes.  Swimming laps for 20 minutes.  Walking up stairs for 15 minutes.  Bicycling 4 miles in 15 minutes.  Gardening for 30 to 45 minutes.  Jumping rope for 15 minutes.  Washing windows or floors for 45 to 60 minutes. Document Released: 10/22/2010 Document Revised: 12/12/2011 Document Reviewed: 10/22/2010 ExitCare Patient Information 2013 Exit[employer].   Other topics ( that may be useful information):    Sexually Transmitted Disease Sexually transmitted disease (STD) refers to any infection that is passed from person to person during sexual activity. This may happen by way of saliva, semen, blood, vaginal mucus, or urine. Common STDs include:  Gonorrhea.  Chlamydia.  Syphilis.  HIV/AIDS.  Genital herpes.  Hepatitis B and C.  Trichomonas.  Human papillomavirus (HPV).  Pubic lice. CAUSES  An STD may be spread by bacteria, virus, or parasite. A person can get an STD by:  Sexual intercourse with an infected person.  Sharing sex toys with an infected person.  Sharing needles with an infected person.  Having intimate contact with the genitals, mouth, or rectal areas of an infected person. SYMPTOMS  Some people may not have any symptoms, but   they can still pass the infection to others. Different STDs have different symptoms. Symptoms include:  Painful or bloody urination.  Pain in the pelvis, abdomen, vagina, anus, throat, or eyes.  Skin rash, itching, irritation, growths, or sores (lesions). These usually occur in the genital or anal area.  Abnormal vaginal discharge.  Penile discharge in men.  Soft, flesh-colored skin growths in the  genital or anal area.  Fever.  Pain or bleeding during sexual intercourse.  Swollen glands in the groin area.  Yellow skin and eyes (jaundice). This is seen with hepatitis. DIAGNOSIS  To make a diagnosis, your caregiver may:  Take a medical history.  Perform a physical exam.  Take a specimen (culture) to be examined.  Examine a sample of discharge under a microscope.  Perform blood test TREATMENT   Chlamydia, gonorrhea, trichomonas, and syphilis can be cured with antibiotic medicine.  Genital herpes, hepatitis, and HIV can be treated, but not cured, with prescribed medicines. The medicines will lessen the symptoms.  Genital warts from HPV can be treated with medicine or by freezing, burning (electrocautery), or surgery. Warts may come back.  HPV is a virus and cannot be cured with medicine or surgery.However, abnormal areas may be followed very closely by your caregiver and may be removed from the cervix, vagina, or vulva through office procedures or surgery. If your diagnosis is confirmed, your recent sexual partners need treatment. This is true even if they are symptom-free or have a negative culture or evaluation. They should not have sex until their caregiver says it is okay. HOME CARE INSTRUCTIONS  All sexual partners should be informed, tested, and treated for all STDs.  Take your antibiotics as directed. Finish them even if you start to feel better.  Only take over-the-counter or prescription medicines for pain, discomfort, or fever as directed by your caregiver.  Rest.  Eat a balanced diet and drink enough fluids to keep your urine clear or pale yellow.  Do not have sex until treatment is completed and you have followed up with your caregiver. STDs should be checked after treatment.  Keep all follow-up appointments, Pap tests, and blood tests as directed by your caregiver.  Only use latex condoms and water-soluble lubricants during sexual activity. Do not use  petroleum jelly or oils.  Avoid alcohol and illegal drugs.  Get vaccinated for HPV and hepatitis. If you have not received these vaccines in the past, talk to your caregiver about whether one or both might be right for you.  Avoid risky sex practices that can break the skin. The only way to avoid getting an STD is to avoid all sexual activity.Latex condoms and dental dams (for oral sex) will help lessen the risk of getting an STD, but will not completely eliminate the risk. SEEK MEDICAL CARE IF:   You have a fever.  You have any new or worsening symptoms. Document Released: 12/10/2002 Document Revised: 12/12/2011 Document Reviewed: 12/17/2010 [facility] Patient Information 2013 [place].    Domestic Abuse You are being battered or abused if someone close to you hits, pushes, or physically hurts you in any way. You also are being abused if you are forced into activities. You are being sexually abused if you are forced to have sexual contact of any kind. You are being emotionally abused if you are made to feel worthless or if you are constantly threatened. It is important to remember that help is available. No one has the right to abuse you. PREVENTION OF FURTHER  ABUSE  Learn the warning signs of danger. This varies with situations but may include: the use of alcohol, threats, isolation from friends and family, or forced sexual contact. Leave if you feel that violence is going to occur.  If you are attacked or beaten, report it to the police so the abuse is documented. You do not have to press charges. The police can protect you while you or the attackers are leaving. Get the officer's name and badge number and a copy of the report.  Find someone you can trust and tell them what is happening to you: your caregiver, a nurse, clergy member, close friend or family member. Feeling ashamed is natural, but remember that you have done nothing wrong. No one deserves abuse. Document Released:  09/16/2000 Document Revised: 12/12/2011 Document Reviewed: 11/25/2010 ExitCare Patient Information 2013 [employer]Care[employer].    How Much is Too Much Alcohol? Drinking too much alcohol can cause injury, accidents, and health problems. These types of problems can include:   Car crashes.  Falls.  Family fighting (domestic violence).  Drowning.  Fights.  Injuries.  Burns.  Damage to certain organs.  Having a baby with birth defects. ONE DRINK CAN BE TOO MUCH WHEN YOU ARE:  Working.  Pregnant or breastfeeding.  Taking medicines. Ask your doctor.  Driving or planning to drive. If you or someone you know has a drinking problem, get help from a doctor.  Document Released: 07/16/2009 Document Revised: 12/12/2011 Document Reviewed: 07/16/2009 ExitCare Patient Information 2013 [employer]Care[employer].   Smoking Hazards Smoking cigarettes is extremely bad for your health. Tobacco smoke has over 200 known poisons in it. There are over 60 chemicals in tobacco smoke that cause cancer. Some of the chemicals found in cigarette smoke include:   Cyanide.  Benzene.  Formaldehyde.  Methanol (wood alcohol).  Acetylene (fuel used in welding torches).  Ammonia. Cigarette smoke also contains the poisonous gases nitrogen oxide and carbon monoxide.  Cigarette smokers have an increased risk of many serious medical problems and Smoking causes approximately:  90% of all lung cancer deaths in men.  80% of all lung cancer deaths in women.  90% of deaths from chronic obstructive lung disease. Compared with nonsmokers, smoking increases the risk of:  Coronary heart disease by 2 to 4 times.  Stroke by 2 to 4 times.  Men developing lung cancer by 23 times.  Women developing lung cancer by 13 times.  Dying from chronic obstructive lung diseases by 12 times.  . Smoking is the most preventable cause of death and disease in our society.  WHY IS SMOKING ADDICTIVE?  Nicotine is the chemical  agent in tobacco that is capable of causing addiction or dependence.  When you smoke and inhale, nicotine is absorbed rapidly into the bloodstream through your lungs. Nicotine absorbed through the lungs is capable of creating a powerful addiction. Both inhaled and non-inhaled nicotine may be addictive.  Addiction studies of cigarettes and spit tobacco show that addiction to nicotine occurs mainly during the teen years, when young people begin using tobacco products. WHAT ARE THE BENEFITS OF QUITTING?  There are many health benefits to quitting smoking.   Likelihood of developing cancer and heart disease decreases. Health improvements are seen almost immediately.  Blood pressure, pulse rate, and breathing patterns start returning to normal soon after quitting. QUITTING SMOKING   American Lung Association - 1-800-LUNGUSA  American Cancer Society - 1-800-ACS-2345 Document Released: 10/27/2004 Document Revised: 12/12/2011 Document Reviewed: 07/01/2009 ExitCare Patient Information 2013 ExitCare,   LLC.   Stress Management Stress is a state of physical or mental tension that often results from changes in your life or normal routine. Some common causes of stress are:  Death of a loved one.  Injuries or severe illnesses.  Getting fired or changing jobs.  Moving into a new home. Other causes may be:  Sexual problems.  Business or financial losses.  Taking on a large debt.  Regular conflict with someone at home or at work.  Constant tiredness from lack of sleep. It is not just bad things that are stressful. It may be stressful to:  Win the lottery.  Get married.  Buy a new car. The amount of stress that can be easily tolerated varies from person to person. Changes generally cause stress, regardless of the types of change. Too much stress can affect your health. It may lead to physical or emotional problems. Too little stress (boredom) may also become stressful. SUGGESTIONS TO  REDUCE STRESS:  Talk things over with your family and friends. It often is helpful to share your concerns and worries. If you feel your problem is serious, you may want to get help from a professional counselor.  Consider your problems one at a time instead of lumping them all together. Trying to take care of everything at once may seem impossible. List all the things you need to do and then start with the most important one. Set a goal to accomplish 2 or 3 things each day. If you expect to do too many in a single day you will naturally fail, causing you to feel even more stressed.  Do not use alcohol or drugs to relieve stress. Although you may feel better for a short time, they do not remove the problems that caused the stress. They can also be habit forming.  Exercise regularly - at least 3 times per week. Physical exercise can help to relieve that "uptight" feeling and will relax you.  The shortest distance between despair and hope is often a good night's sleep.  Go to bed and get up on time allowing yourself time for appointments without being rushed.  Take a short "time-out" period from any stressful situation that occurs during the day. Close your eyes and take some deep breaths. Starting with the muscles in your face, tense them, hold it for a few seconds, then relax. Repeat this with the muscles in your neck, shoulders, hand, stomach, back and legs.  Take good care of yourself. Eat a balanced diet and get plenty of rest.  Schedule time for having fun. Take a break from your daily routine to relax. HOME CARE INSTRUCTIONS   Call if you feel overwhelmed by your problems and feel you can no longer manage them on your own.  Return immediately if you feel like hurting yourself or someone else. Document Released: 03/15/2001 Document Revised: 12/12/2011 Document Reviewed: 11/05/2007 [facility] Patient Information 2013 [place].  Oral Contraception Use Oral contraceptive pills (OCPs)  are medicines taken to prevent pregnancy. OCPs work by preventing the ovaries from releasing eggs. The hormones in OCPs also cause the cervical mucus to thicken, preventing the sperm from entering the uterus. The hormones also cause the uterine lining to become thin, not allowing a fertilized egg to attach to the inside of the uterus. OCPs are highly effective when taken exactly as prescribed. However, OCPs do not prevent sexually transmitted diseases (STDs). Safe sex practices, such as using condoms along with an OCP, can help prevent STDs. Before  taking OCPs, you may have a physical exam and Pap test. Your health care provider may also order blood tests if necessary. Your health care provider will make sure you are a good candidate for oral contraception. Discuss with your health care provider the possible side effects of the OCP you may be prescribed. When starting an OCP, it can take 2 to 3 months for the body to adjust to the changes in hormone levels in your body.  HOW TO TAKE ORAL CONTRACEPTIVE PILLS Your health care provider may advise you on how to start taking the first cycle of OCPs. Otherwise, you can:   Start on day 1 of your menstrual period. You will not need any backup contraceptive protection with this start time.   Start on the first Sunday after your menstrual period or the day you get your prescription. In these cases, you will need to use backup contraceptive protection for the first week.   Start the pill at any time of your cycle. If you take the pill within 5 days of the start of your period, you are protected against pregnancy right away. In this case, you will not need a backup form of birth control. If you start at any other time of your menstrual cycle, you will need to use another form of birth control for 7 days. If your OCP is the type called a minipill, it will protect you from pregnancy after taking it for 2 days (48 hours). After you have started taking OCPs:   If you  forget to take 1 pill, take it as soon as you remember. Take the next pill at the regular time.   If you miss 2 or more pills, call your health care provider because different pills have different instructions for missed doses. Use backup birth control until your next menstrual period starts.   If you use a 28-day pack that contains inactive pills and you miss 1 of the last 7 pills (pills with no hormones), it will not matter. Throw away the rest of the non-hormone pills and start a new pill pack.  No matter which day you start the OCP, you will always start a new pack on that same day of the week. Have an extra pack of OCPs and a backup contraceptive method available in case you miss some pills or lose your OCP pack.  HOME CARE INSTRUCTIONS   Do not smoke.   Always use a condom to protect against STDs. OCPs do not protect against STDs.   Use a calendar to mark your menstrual period days.   Read the information and directions that came with your OCP. Talk to your health care provider if you have questions.  SEEK MEDICAL CARE IF:   You develop nausea and vomiting.   You have abnormal vaginal discharge or bleeding.   You develop a rash.   You miss your menstrual period.   You are losing your hair.   You need treatment for mood swings or depression.   You get dizzy when taking the OCP.   You develop acne from taking the OCP.   You become pregnant.  SEEK IMMEDIATE MEDICAL CARE IF:   You develop chest pain.   You develop shortness of breath.   You have an uncontrolled or severe headache.   You develop numbness or slurred speech.   You develop visual problems.   You develop pain, redness, and swelling in the legs.    This information is not intended to  replace advice given to you by your health care provider. Make sure you discuss any questions you have with your health care provider.   Document Released: 09/08/2011 Document Revised: 10/10/2014  Document Reviewed: 03/10/2013 Elsevier Interactive Patient Education [employer].

## 2015-10-03 NOTE — Progress Notes (Signed)
Reviewed personally.  M. Suzanne Mescal Flinchbaugh, MD.  

## 2016-10-13 ENCOUNTER — Ambulatory Visit: Payer: BLUE CROSS/BLUE SHIELD | Admitting: Certified Nurse Midwife

## 2016-10-26 ENCOUNTER — Encounter: Payer: Self-pay | Admitting: Certified Nurse Midwife

## 2016-10-26 ENCOUNTER — Ambulatory Visit (INDEPENDENT_AMBULATORY_CARE_PROVIDER_SITE_OTHER): Payer: BLUE CROSS/BLUE SHIELD | Admitting: Certified Nurse Midwife

## 2016-10-26 VITALS — BP 110/68 | HR 70 | Resp 16 | Ht 67.75 in | Wt 168.0 lb

## 2016-10-26 DIAGNOSIS — Z01419 Encounter for gynecological examination (general) (routine) without abnormal findings: Secondary | ICD-10-CM

## 2016-10-26 DIAGNOSIS — Z124 Encounter for screening for malignant neoplasm of cervix: Secondary | ICD-10-CM

## 2016-10-26 DIAGNOSIS — Z23 Encounter for immunization: Secondary | ICD-10-CM | POA: Diagnosis not present

## 2016-10-26 DIAGNOSIS — Z0000 Encounter for general adult medical examination without abnormal findings: Secondary | ICD-10-CM | POA: Diagnosis not present

## 2016-10-26 DIAGNOSIS — Z3041 Encounter for surveillance of contraceptive pills: Secondary | ICD-10-CM | POA: Diagnosis not present

## 2016-10-26 LAB — POCT URINALYSIS DIPSTICK
Bilirubin, UA: NEGATIVE
Blood, UA: NEGATIVE
Glucose, UA: NEGATIVE
Ketones, UA: NEGATIVE
LEUKOCYTES UA: NEGATIVE
NITRITE UA: NEGATIVE
PROTEIN UA: NEGATIVE
UROBILINOGEN UA: NEGATIVE
pH, UA: 5

## 2016-10-26 LAB — HEMOGLOBIN, FINGERSTICK: HEMOGLOBIN, FINGERSTICK: 13.6 g/dL (ref 12.0–15.0)

## 2016-10-26 MED ORDER — NORETHIN ACE-ETH ESTRAD-FE 1-20 MG-MCG(24) PO CHEW: 1.0000 mg | CHEWABLE_TABLET | Freq: Every day | ORAL | 4 refills | Status: DC

## 2016-10-26 NOTE — Patient Instructions (Signed)

## 2016-10-26 NOTE — Progress Notes (Signed)
26 y.o. G0P0000 Married  Caucasian Fe here for annual exam. Contraception OCP working well, very scant spotting only with periods. May try for pregnancy in the next year. 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 No other health issues today. Taking trip to [place] in April!    Patient's last menstrual period was 08/03/2016.          Sexually active: Yes.    The current method of family planning is OCP (estrogen/progesterone) & condoms Exercising: Yes.    walking & zumba Smoker:  no  Health Maintenance: Pap:  09-30-14 neg  MMG:  none Colonoscopy:  none BMD:   none TDaP:  2008 Shingles: no Pneumonia: no Hep C and HIV: not done Labs: poct urine-neg, hgb-13.6 Self breast exam: done oc   reports that she has never smoked. She has never used smokeless tobacco. She reports that she drinks about 1.2 oz of alcohol per week . She reports that she does not use drugs.  Past Medical History:  Diagnosis Date  . Carpal tunnel syndrome     History reviewed. No pertinent surgical history.  Current Outpatient Prescriptions  Medication Sig Dispense Refill  . ibuprofen (ADVIL,MOTRIN) 200 MG tablet Take 200 mg by mouth every 6 (six) hours as needed.    . Norethin Ace-Eth Estrad-FE (MINASTRIN 24 FE) 1-20 MG-MCG(24) CHEW Chew 1 mg by mouth daily. 84 tablet 4   No current facility-administered medications for this visit.     Family History  Problem Relation Age of Onset  . Hypertension Father   . Diabetes Father   . Diabetes Maternal Grandfather   . Heart attack Maternal Grandfather   . Heart attack Paternal Grandfather     ROS:  Pertinent items are noted in HPI.  Otherwise, a comprehensive ROS was negative.  Exam:   BP 110/68   Pulse 70   Resp 16   Ht 5' 7.75" (1.721 m)   Wt 168 lb (76.2 kg)   LMP 08/03/2016   BMI 25.73 kg/m  Height: 5' 7.75" (172.1 cm) Ht Readings from Last 3 Encounters:  10/26/16 5' 7.75" (1.721 m)  10/02/15 5' 7.75" (1.721 m)  09/30/14 5'  7.75" (1.721 m)    General appearance: alert, cooperative and appears stated age Head: Normocephalic, without obvious abnormality, atraumatic Neck: no adenopathy, supple, symmetrical, trachea midline and thyroid normal to inspection and palpation Lungs: clear to auscultation bilaterally Breasts: normal appearance, no masses or tenderness, No nipple retraction or dimpling, No nipple discharge or bleeding, No axillary or supraclavicular adenopathy Heart: regular rate and rhythm Abdomen: soft, non-tender; no masses,  no organomegaly Extremities: extremities normal, atraumatic, no cyanosis or edema Skin: Skin color, texture, turgor normal. No rashes or lesions Lymph nodes: Cervical, supraclavicular, and axillary nodes normal. No abnormal inguinal nodes palpated Neurologic: Grossly normal   Pelvic: External genitalia:  no lesions              Urethra:  normal appearing urethra with no masses, tenderness or lesions              Bartholin's and Skene's: normal                 Vagina: normal appearing vagina with normal color and discharge, no lesions              Cervix: no cervical motion tenderness, no lesions and nulliparous appearance              Pap taken: Yes.  Bimanual Exam:  Uterus:  normal size, contour, position, consistency, mobility, non-tender and anteverted              Adnexa: normal adnexa and no mass, fullness, tenderness               Rectovaginal: Confirms               Anus:  normal appearance  Chaperone present: yes  A:  Well Woman with normal exam  Contraception OCP desired  May plan pregnancy this year  Immunization update  P:   Reviewed health and wellness pertinent to exam  Rx Minastrin 24 Fe see order with instructions  Discussed Rubella screening, so if immune status  Not present she can up date prior to trying for pregnancy. Discussed concerns with early pregnancy exposure. Encouraged to schedule preconceptual counseling 3  Months prior to trying.   Lab:  Rubella  Requests TDAP  Pap smear as above with HPV reflex   counseled on breast self exam, use and side effects of OCP's, adequate intake of calcium and vitamin D, diet and exercise  return annually or prn  An After Visit Summary was printed and given to the patient.

## 2016-10-27 LAB — RUBELLA SCREEN: Rubella: 1.66 Index — ABNORMAL HIGH (ref <0.90)

## 2016-10-27 NOTE — Progress Notes (Signed)
Encounter reviewed Jill Jertson, MD   

## 2016-10-28 LAB — IPS PAP TEST WITH REFLEX TO HPV

## 2017-01-11 ENCOUNTER — Ambulatory Visit (INDEPENDENT_AMBULATORY_CARE_PROVIDER_SITE_OTHER): Payer: BLUE CROSS/BLUE SHIELD | Admitting: Certified Nurse Midwife

## 2017-01-11 ENCOUNTER — Encounter: Payer: Self-pay | Admitting: Certified Nurse Midwife

## 2017-01-11 VITALS — BP 104/78 | HR 68 | Resp 16 | Ht 67.75 in | Wt 167.0 lb

## 2017-01-11 DIAGNOSIS — Z3169 Encounter for other general counseling and advice on procreation: Secondary | ICD-10-CM | POA: Diagnosis not present

## 2017-01-11 NOTE — Progress Notes (Signed)
26 y.o.Married Caucasian  Female here for preconceptual counseling. Periods sporadic with Minastrin 24, but feels like period will start during the placebo week, but no bleeding in the past few months.  Spouse here with patient at her request. No health issues since last aex on 10/26/16. Spouse is Emergency planning/management officer.  Gynecological History:    Menarche:age 8 LMP:11/20171  Length of cycle:28 days Length of Menses: 3-4 days GYN infectious disease history:  (Abnormal pap, venereal warts, herpes, or other STD's )No Current Birth Control method: OCP Last time birth control was used:this month  PMH:  Any history of DM, HTN, epilepsy, Heart Murmur, or thyroid problems? No   If so, when did it begin? Are you or have you ever been anemic?No If so for how long? Have you ever had any accidents? No What type? Do you have any allergies? No Do you take any sedatives or tranquilizers? No Any domestic violence? No Any medications? No   (such as medicationss for acne - certain medications can cause birth defects and Ace inhibitors can cause kidney problems in the fetus.)  Prenatal vitamins and Zyrtec prn  Patient's Past Medical History:  Have you ever had surgery related to female organs? No Past pregnancies/ complications/ or miscarriages/ abortions? No ETOH? Social and limited Tobacco Use?No Drug use? Yes  Reviewed Medication list: Yes Current job exposure risk - toxins/ Lead/ Mercury No Hot tub/ sauna use? Yes Do you commonly run long distance or do strenuous exercise? No Do you eat a strict vegetarian diet? No  Partners Past Medical History:  Have you ever had surgery related to female organs? No Previous Paternity? No Testicular Injury? No ETOH No Tobacco use: No Drug use No Current medications: Zyrtec Current job exposure risk toxins/ lead/ mercury No, possible exposure Hot/ tub sauna use? No Do you commonly run long distance or do strenuous exercise? No   Patient's Family Medical  history: Yes Partners Family Medical History: Yes  Ethnic background? Meditterranean/ Asian/Chinese/ Ashkenazi Jews / [place] / [place] No   Patients FMH:      Partners FMH: Multiple Births No   Multiple Births No Genetic Disorders Yes   Genetic Disorders Yes  Sickle cell      Sickle Cell  Hemophilia      Hemophila  Cystic Fibrosis     Cystic Fibrosis  Mental retardation     Mental retardation  Downs Syndrome     Downs Syndrome  Immunization Updates: Rubella Vaccine/ titer Yes immune status with blood Chicken Pox / vaccination Yes Toxoplasmosis exposure (no changing litter box) No Tdap for pt. and partner Yes Hepatitis B (if at high risk) Yes Influenza vaccine who may get pregnant during the flu season No  Recommendations reviewed regarding once starts trying for pregnancy and once pregnant   No ETOH / Tobacco / Drugs  Limit Caffeine use  No Artificial Sweeteners  No raw beef  Restrict High fat foods, choose from fresh vegetables, lean meats and fresh fruits, milk for calcium, can use almond  also  Limit servings of large fish (e.g., swordfish) to 1 X month  Foods associated with Listeria transmission ( e.g., sliced delicatessen meats & soft cheeses, such as Brie, blue cheese, Queso Cheese)  Limited hot tub/ Saunas prior to pregnancy and no hot tub or sauna once pregnant    Discussed with patient and spouse the Normal preganancy rates 80 % within 1 year once begins to try for pregnancy.  Recommend stopping OCP and condom use first month to allow normal cycle to resume. Keep menses calendar and discussed BBT too help with predicting ovulatory time and timing of intercourse.. Questions addressed.  Stressed importance of Prenatal Multivitamins daily and checking on other medications before she takes them Patient instructed to call when misses period or positive pregnancy test for OV for confirmation. If not pregnancy by 6  months advise, aware may take up to one year.  Rv prn as above.   Time spent with patient/spouse 36 minutes

## 2017-01-12 ENCOUNTER — Encounter: Payer: Self-pay | Admitting: Certified Nurse Midwife

## 2017-01-12 NOTE — Patient Instructions (Signed)

## 2017-01-21 NOTE — Progress Notes (Signed)
Encounter reviewed Jill Jertson, MD   

## 2017-09-15 DIAGNOSIS — J0100 Acute maxillary sinusitis, unspecified: Secondary | ICD-10-CM | POA: Diagnosis not present

## 2017-10-26 DIAGNOSIS — F331 Major depressive disorder, recurrent, moderate: Secondary | ICD-10-CM | POA: Diagnosis not present

## 2017-10-26 DIAGNOSIS — F411 Generalized anxiety disorder: Secondary | ICD-10-CM | POA: Diagnosis not present

## 2017-10-31 ENCOUNTER — Ambulatory Visit: Payer: BLUE CROSS/BLUE SHIELD | Admitting: Certified Nurse Midwife

## 2017-11-07 NOTE — Progress Notes (Signed)
27 y.o. G0P0000 Married  Caucasian Fe here for annual exam. Periods normal, but not exactly regular each month since stopping OCP, 5/18.Duration 5-8 days,heavy 2-3 days, minimal cramping. Much stress with dog"s illness and surgery, doing well. Father also has been in hospital. Was trying for pregnancy, but has put this on hold for now. Desires no contraception. Continues prenatal vitamins. Has loss weight during this interval also. Feeling much better now, saw MD and treated for URI infection.  Complaining of vaginal discharge with some itching since antibiotic use. Please check.  No other health issues.   Patient's last menstrual period was 10/24/2017 (exact date).          Sexually active: Yes.    The current method of family planning is none.    Exercising: Yes.    walking Smoker:  no  Health Maintenance: Pap:  09-30-14 neg, 10-26-16 neg History of Abnormal Pap: no MMG:  none Self Breast exams: yes Colonoscopy:  none BMD:   none TDaP:  2018 Shingles: no Pneumonia: no Hep C and HIV: not done Labs: no   reports that  has never smoked. she has never used smokeless tobacco. She reports that she drinks about 0.6 - 1.2 oz of alcohol per week. She reports that she does not use drugs.  Past Medical History:  Diagnosis Date  . Anxiety   . Carpal tunnel syndrome     History reviewed. No pertinent surgical history.  Current Outpatient Medications  Medication Sig Dispense Refill  . hydrOXYzine (ATARAX/VISTARIL) 50 MG tablet Take 50 mg by mouth every 8 (eight) hours as needed.  1  . ibuprofen (ADVIL,MOTRIN) 200 MG tablet Take 200 mg by mouth every 6 (six) hours as needed.    . Prenatal Vit-Fe Fumarate-FA (MULTIVITAMIN-PRENATAL) 27-0.8 MG TABS tablet Take 1 tablet by mouth daily at 12 noon.     No current facility-administered medications for this visit.     Family History  Problem Relation Age of Onset  . Hypertension Father   . Diabetes Father   . Diabetes Maternal Grandfather   .  Heart attack Maternal Grandfather   . Heart attack Paternal Grandfather     ROS:  Pertinent items are noted in HPI.  Otherwise, a comprehensive ROS was negative.  Exam:   BP 94/60   Pulse 68   Resp 16   Ht 5' 7.75" (1.721 m)   Wt 147 lb (66.7 kg)   LMP 10/24/2017 (Exact Date)   BMI 22.52 kg/m  Height: 5' 7.75" (172.1 cm) Ht Readings from Last 3 Encounters:  11/08/17 5' 7.75" (1.721 m)  01/11/17 5' 7.75" (1.721 m)  10/26/16 5' 7.75" (1.721 m)    General appearance: alert, cooperative and appears stated age Head: Normocephalic, without obvious abnormality, atraumatic Neck: no adenopathy, supple, symmetrical, trachea midline and thyroid normal to inspection and palpation Lungs: clear to auscultation bilaterally Breasts: normal appearance, no masses or tenderness, No nipple retraction or dimpling, No nipple discharge or bleeding, No axillary or supraclavicular adenopathy Heart: regular rate and rhythm Abdomen: soft, non-tender; no masses,  no organomegaly Extremities: extremities normal, atraumatic, no cyanosis or edema Skin: Skin color, texture, turgor normal. No rashes or lesions Lymph nodes: Cervical, supraclavicular, and axillary nodes normal. No abnormal inguinal nodes palpated Neurologic: Grossly normal   Pelvic: External genitalia:  no lesions              Urethra:  normal appearing urethra with no masses, tenderness or lesions  Bartholin's and Skene's: normal                 Vagina: normal appearing vagina with normal color and copious white discharge, no lesions. Wet Prep taken, Ph 4.0              Cervix: no cervical motion tenderness, no lesions and nulliparous appearance              Pap taken: No. Bimanual Exam:  Uterus:  normal size, contour, position, consistency, mobility, non-tender              Adnexa: normal adnexa and no mass, fullness, tenderness               Rectovaginal: Confirms               Anus:  normal appearance  Wet prep: KOH,Saline  + yeast only  Chaperone present: yes  A:  Well Woman with normal exam  Contraception none desired planning pregnancy  Yeast vaginitis, antibiotic induced with URI treatment  Social stress with family and dog, now resolved, had weight loss as sequelae, eating well again  P:   Reviewed health and wellness pertinent to exam  Patient will plan to try again later this year, continue with Prenatal Vitamins as discussed at consult. Notify when positive pregnancy test  Discussed findings of yeast vaginitis and etiology.  Comfort measures with aveeno or baking soda sitz bath given. Rx Terazol 7 Vaginal cream see order with instructions  Encouraged to take time for self now  Pap smear: no   counseled on breast self exam, feminine hygiene, adequate intake of calcium and vitamin D, diet and exercise, Kegel's exercises  return annually or prn  An After Visit Summary was printed and given to the patient.

## 2017-11-08 ENCOUNTER — Other Ambulatory Visit: Payer: Self-pay

## 2017-11-08 ENCOUNTER — Encounter: Payer: Self-pay | Admitting: Certified Nurse Midwife

## 2017-11-08 ENCOUNTER — Ambulatory Visit (INDEPENDENT_AMBULATORY_CARE_PROVIDER_SITE_OTHER): Payer: BLUE CROSS/BLUE SHIELD | Admitting: Certified Nurse Midwife

## 2017-11-08 VITALS — BP 94/60 | HR 68 | Resp 16 | Ht 67.75 in | Wt 147.0 lb

## 2017-11-08 DIAGNOSIS — B373 Candidiasis of vulva and vagina: Secondary | ICD-10-CM | POA: Diagnosis not present

## 2017-11-08 DIAGNOSIS — Z01419 Encounter for gynecological examination (general) (routine) without abnormal findings: Secondary | ICD-10-CM

## 2017-11-08 DIAGNOSIS — B3731 Acute candidiasis of vulva and vagina: Secondary | ICD-10-CM

## 2017-11-08 DIAGNOSIS — Z659 Problem related to unspecified psychosocial circumstances: Secondary | ICD-10-CM | POA: Diagnosis not present

## 2017-11-08 MED ORDER — TERCONAZOLE 0.4 % VA CREA: 1.0000 | TOPICAL_CREAM | Freq: Every day | VAGINAL | 0 refills | Status: DC

## 2017-11-08 NOTE — Patient Instructions (Signed)
General topics  Next pap or exam is  due in 1 year Take a Women's multivitamin Take 1200 mg. of calcium daily - prefer dietary If any concerns in interim to call back  Breast Self-Awareness Practicing breast self-awareness may pick up problems early, prevent significant medical complications, and possibly save your life. By practicing breast self-awareness, you can become familiar with how your breasts look and feel and if your breasts are changing. This allows you to notice changes early. It can also offer you some reassurance that your breast health is good. One way to learn what is normal for your breasts and whether your breasts are changing is to do a breast self-exam. If you find a lump or something that was not present in the past, it is best to contact your caregiver right away. Other findings that should be evaluated by your caregiver include nipple discharge, especially if it is bloody; skin changes or reddening; areas where the skin seems to be pulled in (retracted); or new lumps and bumps. Breast pain is seldom associated with cancer (malignancy), but should also be evaluated by a caregiver. BREAST SELF-EXAM The best time to examine your breasts is 5 7 days after your menstrual period is over.  ExitCare Patient Information 2013 [employer].   Exercise to Stay Healthy Exercise helps you become and stay healthy. EXERCISE IDEAS AND TIPS Choose exercises that:  You enjoy.  Fit into your day. You do not need to exercise really hard to be healthy. You can do exercises at a slow or medium level and stay healthy. You can:  Stretch before and after working out.  Try yoga, Pilates, or tai chi.  Lift weights.  Walk fast, swim, jog, run, climb stairs, bicycle, dance, or rollerskate.  Take aerobic classes. Exercises that burn about 150 calories:  Running 1  miles in 15 minutes.  Playing volleyball for 45 to 60 minutes.  Washing and waxing a car for 45 to 60  minutes.  Playing touch football for 45 minutes.  Walking 1  miles in 35 minutes.  Pushing a stroller 1  miles in 30 minutes.  Playing basketball for 30 minutes.  Raking leaves for 30 minutes.  Bicycling 5 miles in 30 minutes.  Walking 2 miles in 30 minutes.  Dancing for 30 minutes.  Shoveling snow for 15 minutes.  Swimming laps for 20 minutes.  Walking up stairs for 15 minutes.  Bicycling 4 miles in 15 minutes.  Gardening for 30 to 45 minutes.  Jumping rope for 15 minutes.  Washing windows or floors for 45 to 60 minutes. Document Released: 10/22/2010 Document Revised: 12/12/2011 Document Reviewed: 10/22/2010 ExitCare Patient Information 2013 [employer].   Other topics ( that may be useful information):    Sexually Transmitted Disease Sexually transmitted disease (STD) refers to any infection that is passed from person to person during sexual activity. This may happen by way of saliva, semen, blood, vaginal mucus, or urine. Common STDs include:  Gonorrhea.  Chlamydia.  Syphilis.  HIV/AIDS.  Genital herpes.  Hepatitis B and C.  Trichomonas.  Human papillomavirus (HPV).  Pubic lice. CAUSES  An STD may be spread by bacteria, virus, or parasite. A person can get an STD by:  Sexual intercourse with an infected person.  Sharing sex toys with an infected person.  Sharing needles with an infected person.  Having intimate contact with the genitals, mouth, or rectal areas of an infected person. SYMPTOMS  Some people may not have any symptoms, but   they can still pass the infection to others. Different STDs have different symptoms. Symptoms include:  Painful or bloody urination.  Pain in the pelvis, abdomen, vagina, anus, throat, or eyes.  Skin rash, itching, irritation, growths, or sores (lesions). These usually occur in the genital or anal area.  Abnormal vaginal discharge.  Penile discharge in men.  Soft, flesh-colored skin growths in the  genital or anal area.  Fever.  Pain or bleeding during sexual intercourse.  Swollen glands in the groin area.  Yellow skin and eyes (jaundice). This is seen with hepatitis. DIAGNOSIS  To make a diagnosis, your caregiver may:  Take a medical history.  Perform a physical exam.  Take a specimen (culture) to be examined.  Examine a sample of discharge under a microscope.  Perform blood test TREATMENT   Chlamydia, gonorrhea, trichomonas, and syphilis can be cured with antibiotic medicine.  Genital herpes, hepatitis, and HIV can be treated, but not cured, with prescribed medicines. The medicines will lessen the symptoms.  Genital warts from HPV can be treated with medicine or by freezing, burning (electrocautery), or surgery. Warts may come back.  HPV is a virus and cannot be cured with medicine or surgery.However, abnormal areas may be followed very closely by your caregiver and may be removed from the cervix, vagina, or vulva through office procedures or surgery. If your diagnosis is confirmed, your recent sexual partners need treatment. This is true even if they are symptom-free or have a negative culture or evaluation. They should not have sex until their caregiver says it is okay. HOME CARE INSTRUCTIONS  All sexual partners should be informed, tested, and treated for all STDs.  Take your antibiotics as directed. Finish them even if you start to feel better.  Only take over-the-counter or prescription medicines for pain, discomfort, or fever as directed by your caregiver.  Rest.  Eat a balanced diet and drink enough fluids to keep your urine clear or pale yellow.  Do not have sex until treatment is completed and you have followed up with your caregiver. STDs should be checked after treatment.  Keep all follow-up appointments, Pap tests, and blood tests as directed by your caregiver.  Only use latex condoms and water-soluble lubricants during sexual activity. Do not use  petroleum jelly or oils.  Avoid alcohol and illegal drugs.  Get vaccinated for HPV and hepatitis. If you have not received these vaccines in the past, talk to your caregiver about whether one or both might be right for you.  Avoid risky sex practices that can break the skin. The only way to avoid getting an STD is to avoid all sexual activity.Latex condoms and dental dams (for oral sex) will help lessen the risk of getting an STD, but will not completely eliminate the risk. SEEK MEDICAL CARE IF:   You have a fever.  You have any new or worsening symptoms. Document Released: 12/10/2002 Document Revised: 12/12/2011 Document Reviewed: 12/17/2010 [facility] Patient Information 2013 [employer].    Domestic Abuse You are being battered or abused if someone close to you hits, pushes, or physically hurts you in any way. You also are being abused if you are forced into activities. You are being sexually abused if you are forced to have sexual contact of any kind. You are being emotionally abused if you are made to feel worthless or if you are constantly threatened. It is important to remember that help is available. No one has the right to abuse you. PREVENTION OF FURTHER  ABUSE  Learn the warning signs of danger. This varies with situations but may include: the use of alcohol, threats, isolation from friends and family, or forced sexual contact. Leave if you feel that violence is going to occur.  If you are attacked or beaten, report it to the police so the abuse is documented. You do not have to press charges. The police can protect you while you or the attackers are leaving. Get the officer's name and badge number and a copy of the report.  Find someone you can trust and tell them what is happening to you: your caregiver, a nurse, clergy member, close friend or family member. Feeling ashamed is natural, but remember that you have done nothing wrong. No one deserves abuse. Document Released:  09/16/2000 Document Revised: 12/12/2011 Document Reviewed: 11/25/2010 ExitCare Patient Information 2013 [employer].    How Much is Too Much Alcohol? Drinking too much alcohol can cause injury, accidents, and health problems. These types of problems can include:   Car crashes.  Falls.  Family fighting (domestic violence).  Drowning.  Fights.  Injuries.  Burns.  Damage to certain organs.  Having a baby with birth defects. ONE DRINK CAN BE TOO MUCH WHEN YOU ARE:  Working.  Pregnant or breastfeeding.  Taking medicines. Ask your doctor.  Driving or planning to drive. If you or someone you know has a drinking problem, get help from a doctor.  Document Released: 07/16/2009 Document Revised: 12/12/2011 Document Reviewed: 07/16/2009 ExitCare Patient Information 2013 [employer].   Smoking Hazards Smoking cigarettes is extremely bad for your health. Tobacco smoke has over 200 known poisons in it. There are over 60 chemicals in tobacco smoke that cause cancer. Some of the chemicals found in cigarette smoke include:   Cyanide.  Benzene.  Formaldehyde.  Methanol (wood alcohol).  Acetylene (fuel used in welding torches).  Ammonia. Cigarette smoke also contains the poisonous gases nitrogen oxide and carbon monoxide.  Cigarette smokers have an increased risk of many serious medical problems and Smoking causes approximately:  90% of all lung cancer deaths in men.  80% of all lung cancer deaths in women.  90% of deaths from chronic obstructive lung disease. Compared with nonsmokers, smoking increases the risk of:  Coronary heart disease by 2 to 4 times.  Stroke by 2 to 4 times.  Men developing lung cancer by 23 times.  Women developing lung cancer by 13 times.  Dying from chronic obstructive lung diseases by 12 times.  . Smoking is the most preventable cause of death and disease in our society.  WHY IS SMOKING ADDICTIVE?  Nicotine is the chemical  agent in tobacco that is capable of causing addiction or dependence.  When you smoke and inhale, nicotine is absorbed rapidly into the bloodstream through your lungs. Nicotine absorbed through the lungs is capable of creating a powerful addiction. Both inhaled and non-inhaled nicotine may be addictive.  Addiction studies of cigarettes and spit tobacco show that addiction to nicotine occurs mainly during the teen years, when young people begin using tobacco products. WHAT ARE THE BENEFITS OF QUITTING?  There are many health benefits to quitting smoking.   Likelihood of developing cancer and heart disease decreases. Health improvements are seen almost immediately.  Blood pressure, pulse rate, and breathing patterns start returning to normal soon after quitting. QUITTING SMOKING   American Lung Association - 1-800-LUNGUSA  American Cancer Society - 1-800-ACS-2345 Document Released: 10/27/2004 Document Revised: 12/12/2011 Document Reviewed: 07/01/2009 ExitCare Patient Information 2013 ExitCare[employer]   LLC.   Stress Management Stress is a state of physical or mental tension that often results from changes in your life or normal routine. Some common causes of stress are:  Death of a loved one.  Injuries or severe illnesses.  Getting fired or changing jobs.  Moving into a new home. Other causes may be:  Sexual problems.  Business or financial losses.  Taking on a large debt.  Regular conflict with someone at home or at work.  Constant tiredness from lack of sleep. It is not just bad things that are stressful. It may be stressful to:  Win the lottery.  Get married.  Buy a new car. The amount of stress that can be easily tolerated varies from person to person. Changes generally cause stress, regardless of the types of change. Too much stress can affect your health. It may lead to physical or emotional problems. Too little stress (boredom) may also become stressful. SUGGESTIONS TO  REDUCE STRESS:  Talk things over with your family and friends. It often is helpful to share your concerns and worries. If you feel your problem is serious, you may want to get help from a professional counselor.  Consider your problems one at a time instead of lumping them all together. Trying to take care of everything at once may seem impossible. List all the things you need to do and then start with the most important one. Set a goal to accomplish 2 or 3 things each day. If you expect to do too many in a single day you will naturally fail, causing you to feel even more stressed.  Do not use alcohol or drugs to relieve stress. Although you may feel better for a short time, they do not remove the problems that caused the stress. They can also be habit forming.  Exercise regularly - at least 3 times per week. Physical exercise can help to relieve that "uptight" feeling and will relax you.  The shortest distance between despair and hope is often a good night's sleep.  Go to bed and get up on time allowing yourself time for appointments without being rushed.  Take a short "time-out" period from any stressful situation that occurs during the day. Close your eyes and take some deep breaths. Starting with the muscles in your face, tense them, hold it for a few seconds, then relax. Repeat this with the muscles in your neck, shoulders, hand, stomach, back and legs.  Take good care of yourself. Eat a balanced diet and get plenty of rest.  Schedule time for having fun. Take a break from your daily routine to relax. HOME CARE INSTRUCTIONS   Call if you feel overwhelmed by your problems and feel you can no longer manage them on your own.  Return immediately if you feel like hurting yourself or someone else. Document Released: 03/15/2001 Document Revised: 12/12/2011 Document Reviewed: 11/05/2007 ExitCare Patient Information 2013 [employer].   

## 2017-11-20 DIAGNOSIS — B9689 Other specified bacterial agents as the cause of diseases classified elsewhere: Secondary | ICD-10-CM | POA: Diagnosis not present

## 2017-11-20 DIAGNOSIS — J019 Acute sinusitis, unspecified: Secondary | ICD-10-CM | POA: Diagnosis not present

## 2017-11-24 DIAGNOSIS — F331 Major depressive disorder, recurrent, moderate: Secondary | ICD-10-CM | POA: Diagnosis not present

## 2017-11-24 DIAGNOSIS — F411 Generalized anxiety disorder: Secondary | ICD-10-CM | POA: Diagnosis not present

## 2017-12-20 ENCOUNTER — Other Ambulatory Visit: Payer: Self-pay | Admitting: Certified Nurse Midwife

## 2017-12-20 DIAGNOSIS — Z3041 Encounter for surveillance of contraceptive pills: Secondary | ICD-10-CM

## 2017-12-20 NOTE — Telephone Encounter (Signed)
Medication refill request: OCP Last AEX:  11/08/17 DL Next AEX: 0/45/402/12/20 DL Last MMG (if hormonal medication request):  Refill authorized: patient trying for pregnancy per note from 11/08/17   Rx refused.

## 2018-03-29 DIAGNOSIS — Z0000 Encounter for general adult medical examination without abnormal findings: Secondary | ICD-10-CM | POA: Diagnosis not present

## 2018-03-29 DIAGNOSIS — E782 Mixed hyperlipidemia: Secondary | ICD-10-CM | POA: Diagnosis not present

## 2018-08-24 ENCOUNTER — Other Ambulatory Visit: Payer: Self-pay

## 2018-08-24 ENCOUNTER — Encounter: Payer: Self-pay | Admitting: Obstetrics & Gynecology

## 2018-08-24 ENCOUNTER — Ambulatory Visit (INDEPENDENT_AMBULATORY_CARE_PROVIDER_SITE_OTHER): Payer: BLUE CROSS/BLUE SHIELD | Admitting: Obstetrics & Gynecology

## 2018-08-24 VITALS — BP 120/80 | HR 64 | Ht 67.75 in | Wt 163.6 lb

## 2018-08-24 DIAGNOSIS — Z3169 Encounter for other general counseling and advice on procreation: Secondary | ICD-10-CM

## 2018-08-24 MED ORDER — LETROZOLE 2.5 MG PO TABS: ORAL_TABLET | ORAL | 0 refills | Status: DC

## 2018-08-24 NOTE — Progress Notes (Signed)
60 yrs Caucasian Married female G0P0000 here for discussion of desires to be pregnant.  Married 7 1/2 years.  Stopped OCPs in [place], 2018.  Cycles are about every 26-31 days.  Flow lasts about five days.  Pt has been keeping tract of cycles.  Has done ovulation testing between days 12-18.  She never got consistent results with testing.  She is currently taking a PNV.  LMP:  08/08/18.  Last year was stressful year for both the patient and her husband.    PMed hx:  negative PSurg Hx: carpel tunnel repair Smoker: Yes  Spouse PMed Hx: migraines. IBS Spouse PSurg hx:  None Spouse smokes: No  D/w pt components of fertility evaluation including testing for ovulation, semen analysis testing, and evaluation of cavity of uterus and for tubal patency.  Speed of this evaluation and proceeding with fertility medication will depend on desires and pt and her spouse.  Also, offered REI referral at anytime during this process if they desire.  Also, due to age, feel should test for ovarian reserve as well.  All of this was written down for pt and she clearly understands the plan.  Questions invited and answered.  Specific instructions regarding each part of evaluation were given to patient.  Flu shot recommended.  Rubella testing has been done and she is immune.  Assessment:  Primary infertility  Plan:   1.  Will check [facility] today  2.  Semen analysis kit and order will be faxed to Dr. Charlett Lango office.  They will call to register as patients and schedule appt.    3.  Will start Femara 5.65m days 5-9 of cycle.  She will call with onset of cycle and day she is going start Femara so can determine when pt should return for day 21-23 progesterone level.  Timed intercourse reviewed.    4.  Will plan SHGM if no pregnancy after 3 months of regular ovulation.  Pt understands this is done timed with menstrual cycle days 1-9.  5.  Continue PNV.  ~25 minutes spent with patient >50% of time was in face to face  discussion of above.

## 2018-08-30 LAB — ANTI MULLERIAN HORMONE: ANTI-MULLERIAN HORMONE (AMH): 3.26 ng/mL

## 2018-09-05 ENCOUNTER — Telehealth: Payer: Self-pay | Admitting: Obstetrics & Gynecology

## 2018-09-05 NOTE — Telephone Encounter (Signed)
Call to patient. Husband test completed on 08-31-18. Advised will follow-up on results and call her back tomorrow, Dr Hyacinth MeekerMiller not in office today.

## 2018-09-05 NOTE — Telephone Encounter (Signed)
Patient is calling requesting semen analysis results. Patient stated that the results can be given to her husband if she is unavailable.

## 2018-09-07 NOTE — Telephone Encounter (Signed)
Test result reviewed by Dr Hyacinth MeekerMiller. Negative for any abnormality- normal result. Call to patient. Advised of results as directed by Dr Hyacinth MeekerMiller. See scanned copy.   Enccunter closed.

## 2018-09-08 ENCOUNTER — Encounter: Payer: Self-pay | Admitting: Obstetrics & Gynecology

## 2018-09-10 ENCOUNTER — Telehealth: Payer: Self-pay | Admitting: Obstetrics & Gynecology

## 2018-09-10 NOTE — Telephone Encounter (Signed)
Spoke with patient. Patient's LMP was 08/08/2018. Had a positive UPT on 12/7 and 12/8. Patient would like a late afternoon appointment for confirmation. Appointment scheduled for tomorrow 12/10 at 3:45 pm with Leota Sauerseborah Leonard CNM. Patient is agreeable to date and time. Patient denies any current problems or concerns.  Routing to provider and will close encounter.

## 2018-09-10 NOTE — Telephone Encounter (Signed)
Patient called and requested an appointment to confirm a positive UPT with Dr. Hyacinth MeekerMiller or Leota Sauerseborah Leonard, CNM. She is now scheduled for Wednesday, 09/12/18, at 4:00 PM with Leota Sauerseborah Leonard, CNM. Patient is a Runner, broadcasting/film/videoteacher and needs an appointment at 4:00 PM or later. Patient last saw Dr. Hyacinth MeekerMiller for fertility. Routing to triage to be sure Dr. Hyacinth MeekerMiller doesn't want to see the patient herself.  LMP: 08/08/18

## 2018-09-11 ENCOUNTER — Encounter: Payer: Self-pay | Admitting: Certified Nurse Midwife

## 2018-09-11 ENCOUNTER — Other Ambulatory Visit: Payer: Self-pay

## 2018-09-11 ENCOUNTER — Ambulatory Visit (INDEPENDENT_AMBULATORY_CARE_PROVIDER_SITE_OTHER): Payer: BLUE CROSS/BLUE SHIELD | Admitting: Certified Nurse Midwife

## 2018-09-11 VITALS — BP 120/76 | HR 68 | Resp 16 | Wt 164.0 lb

## 2018-09-11 DIAGNOSIS — Z3201 Encounter for pregnancy test, result positive: Secondary | ICD-10-CM

## 2018-09-11 DIAGNOSIS — N912 Amenorrhea, unspecified: Secondary | ICD-10-CM

## 2018-09-11 LAB — POCT URINE PREGNANCY: PREG TEST UR: POSITIVE — AB

## 2018-09-11 NOTE — Progress Notes (Signed)
27 y.o.marital  female g0p0 presents with amenorrhea with + UPT on 09/09/18. LMP 08/08/18. Planned  pregnancy and planned to start on Femara this month to try for pregnancy. Complaining of  fatigue. Denies spotting or  bleeding. Some  ? Cramping with tight feeling, but no pain. . Medications she is taking are: Prenatal vitamins  . Patient has not consumed alcohol since + UPT. Spouse supportive and would be here today, but ill with virus at home. No other health issues or changes.  Review of Systems  Constitutional: Negative.   HENT: Negative.   Eyes: Negative.   Respiratory: Negative.   Cardiovascular: Negative.   Gastrointestinal: Negative.        Loose stools normal for patient  Genitourinary: Negative.   Musculoskeletal: Negative.   Skin: Negative.   Neurological: Negative.   Endo/Heme/Allergies: Negative.   Psychiatric/Behavioral: Negative.     O: HPI pertinent to above. Healthy WDWN female Affect: normal, orientation x 3  Last Aex:11/08/17 Pap smear: 10/26/16 negative     Rubella screen:Positive 10/26/16   A: Amenorrhea with positive UPT, 4 wk 6 days [facility] Planned pregnancy   P: Reviewed with patient importance of prenatal care during pregnancy. Given OB provider list. Reviewed nutrition importance of pregnancy and selecting from all food groups and making sure to have adequate protein intake daily. Discussed avoiding raw or exotic fish, soft cheeses due to risk of bacteria . Discussed concerns with FAS with alcohol use in pregnancy. Discussed increase of IUGR and SIDS with smoking use or second smoke. Reviewed warning signs of early pregnancy and need to advise if occurs. Discussed comfort measures for early pregnancy changes. Offered viability PUS here prior to initiating prenatal care and plans to have here at 7 weeks per future plan if she became pregnant. Patient will be scheduled and called with insurance information. Will do HCG quant today and repeat in 3  days, due to trying for pregnancy for sometime. Questions addressed at length.  Labs: HCG quant.  Rv prn  Time spent with patient in face to face counseling regarding pregnancy and prenatal care was 30 minutes.

## 2018-09-12 ENCOUNTER — Other Ambulatory Visit: Payer: Self-pay | Admitting: *Deleted

## 2018-09-12 ENCOUNTER — Other Ambulatory Visit: Payer: Self-pay | Admitting: Certified Nurse Midwife

## 2018-09-12 ENCOUNTER — Ambulatory Visit: Payer: BLUE CROSS/BLUE SHIELD | Admitting: Certified Nurse Midwife

## 2018-09-12 DIAGNOSIS — N912 Amenorrhea, unspecified: Secondary | ICD-10-CM

## 2018-09-12 DIAGNOSIS — Z3201 Encounter for pregnancy test, result positive: Secondary | ICD-10-CM

## 2018-09-12 LAB — BETA HCG QUANT (REF LAB): HCG QUANT: 3855 m[IU]/mL

## 2018-09-14 ENCOUNTER — Other Ambulatory Visit (INDEPENDENT_AMBULATORY_CARE_PROVIDER_SITE_OTHER): Payer: BLUE CROSS/BLUE SHIELD

## 2018-09-14 DIAGNOSIS — Z3201 Encounter for pregnancy test, result positive: Secondary | ICD-10-CM | POA: Diagnosis not present

## 2018-09-14 DIAGNOSIS — N912 Amenorrhea, unspecified: Secondary | ICD-10-CM | POA: Diagnosis not present

## 2018-09-15 LAB — BETA HCG QUANT (REF LAB): HCG QUANT: 8191 m[IU]/mL

## 2018-09-27 ENCOUNTER — Ambulatory Visit (INDEPENDENT_AMBULATORY_CARE_PROVIDER_SITE_OTHER): Payer: BLUE CROSS/BLUE SHIELD

## 2018-09-27 ENCOUNTER — Ambulatory Visit (INDEPENDENT_AMBULATORY_CARE_PROVIDER_SITE_OTHER): Payer: BLUE CROSS/BLUE SHIELD | Admitting: Obstetrics & Gynecology

## 2018-09-27 VITALS — BP 106/68 | HR 66 | Resp 14 | Ht 67.0 in | Wt 158.0 lb

## 2018-09-27 DIAGNOSIS — R350 Frequency of micturition: Secondary | ICD-10-CM

## 2018-09-27 DIAGNOSIS — R110 Nausea: Secondary | ICD-10-CM

## 2018-09-27 DIAGNOSIS — Z3201 Encounter for pregnancy test, result positive: Secondary | ICD-10-CM | POA: Diagnosis not present

## 2018-09-27 DIAGNOSIS — N912 Amenorrhea, unspecified: Secondary | ICD-10-CM

## 2018-09-27 DIAGNOSIS — O3680X1 Pregnancy with inconclusive fetal viability, fetus 1: Secondary | ICD-10-CM

## 2018-09-27 MED ORDER — DOXYLAMINE-PYRIDOXINE 10-10 MG PO TBEC: DELAYED_RELEASE_TABLET | ORAL | 0 refills | Status: DC

## 2018-09-27 NOTE — Progress Notes (Signed)
27 y.o. G1P0000 Married White or Caucasian female here for pelvic ultrasound for viability scan.  Pt's LMP 08/08/18 with EGA 7 1/17 weeks.  Pt is having a lot of trouble with nausea and fatigue.  Would like to discuss options.  Denies vaginal bleeding.  Having some mild LLQ discomfort.  Findings:  UTERUS: normal gestational sac and yolk sac.  Mason JimSingleton IUD with CRL 1.06cm noted.  EGA by ultrasound is 7 1/7 week c/w dating.  [facility]  FCA noted at 149 BPM. ADNEXA: Left ovary: 3.7 x 2.2cm       Right ovary: 3.5 x 2.0cm with 2.3 x 1.7cm corpus luteal cyst CUL DE SAC: no free fluid  Discussion:  Findings reviewed with pt.    No cats in the home.  Tdap 10/26/16.  Had chicken pox.  Aware flu vaccine safe and recommended.  Has not gotten one.  Fish/shellfish/mercury discuss.  Patient does not eat fish.  Unpasteurized cheese/juices discussed.  Nitrites in foods disucssed.  Exercise and intercourse discussed.  Fetal DNA particle testing discussed.  First trimester down's testing discussed.  Cystic fibrosis discussed.    Pt aware transfer of care is appropriate.  Options reviewed.  They will discuss and let me know where records should be transferred.  Treatments for first trimester nausea/emesis discussed.  Assessment:  Viable singleton IUP Nausea/emesis in first trimester  Plan:  Diclegis rx to pharmacy and dosing discussed.  Advised to give call back to office about response to medication Transfer of care appropriate.  They will let me know where to transfer records.  ~20 minutes spent with patient >50% of time was in face to face discussion of above.    Addendum: This pt has a retroverted uterus, not anteverted as stated above.

## 2018-09-30 ENCOUNTER — Encounter: Payer: Self-pay | Admitting: Obstetrics & Gynecology

## 2018-10-03 NOTE — L&D Delivery Note (Signed)
Delivery Note Pt pushed for about 1.5hrs and at 2:02 AM a viable female was delivered via Vaginal, Spontaneous (Presentation:OA ;  ).  APGAR:8,  9, ; weight pending.   Placenta status: delivered, intact ,duncan .  Cord:3vc  with the following complications:nuchal x 1 .  Cord pH: n/a  Anesthesia: Epidural  Episiotomy: None Lacerations: 2nd degree Suture Repair: 2.0 vicryl 4-0 vicryl Est. Blood Loss (mL): 352  Mom to postpartum.  Baby to Couplet care / Skin to Skin.  Kaitlyn Nguyen 05/03/2019, 2:33 AM

## 2018-10-12 DIAGNOSIS — Z23 Encounter for immunization: Secondary | ICD-10-CM | POA: Diagnosis not present

## 2018-10-12 DIAGNOSIS — Z3A09 9 weeks gestation of pregnancy: Secondary | ICD-10-CM | POA: Diagnosis not present

## 2018-10-12 DIAGNOSIS — Z113 Encounter for screening for infections with a predominantly sexual mode of transmission: Secondary | ICD-10-CM | POA: Diagnosis not present

## 2018-10-12 DIAGNOSIS — O26891 Other specified pregnancy related conditions, first trimester: Secondary | ICD-10-CM | POA: Diagnosis not present

## 2018-10-12 DIAGNOSIS — Z3401 Encounter for supervision of normal first pregnancy, first trimester: Secondary | ICD-10-CM | POA: Diagnosis not present

## 2018-10-12 DIAGNOSIS — Z3689 Encounter for other specified antenatal screening: Secondary | ICD-10-CM | POA: Diagnosis not present

## 2018-10-12 LAB — OB RESULTS CONSOLE RUBELLA ANTIBODY, IGM: Rubella: IMMUNE

## 2018-10-12 LAB — OB RESULTS CONSOLE ABO/RH: RH Type: POSITIVE

## 2018-10-12 LAB — OB RESULTS CONSOLE HEPATITIS B SURFACE ANTIGEN: Hepatitis B Surface Ag: NEGATIVE

## 2018-10-12 LAB — OB RESULTS CONSOLE GC/CHLAMYDIA
Chlamydia: NEGATIVE
Gonorrhea: NEGATIVE

## 2018-10-12 LAB — OB RESULTS CONSOLE RPR: RPR: NONREACTIVE

## 2018-10-12 LAB — OB RESULTS CONSOLE ANTIBODY SCREEN: Antibody Screen: NEGATIVE

## 2018-10-12 LAB — OB RESULTS CONSOLE HIV ANTIBODY (ROUTINE TESTING): HIV: NONREACTIVE

## 2018-11-06 DIAGNOSIS — Z362 Encounter for other antenatal screening follow-up: Secondary | ICD-10-CM | POA: Diagnosis not present

## 2018-11-06 DIAGNOSIS — Z3A12 12 weeks gestation of pregnancy: Secondary | ICD-10-CM | POA: Diagnosis not present

## 2018-11-06 DIAGNOSIS — Z3682 Encounter for antenatal screening for nuchal translucency: Secondary | ICD-10-CM | POA: Diagnosis not present

## 2018-11-14 ENCOUNTER — Ambulatory Visit: Payer: BLUE CROSS/BLUE SHIELD | Admitting: Certified Nurse Midwife

## 2018-11-24 ENCOUNTER — Inpatient Hospital Stay (HOSPITAL_COMMUNITY)
Admission: AD | Admit: 2018-11-24 | Discharge: 2018-11-25 | Disposition: A | Discharge status: Home / Self Care | Payer: BLUE CROSS/BLUE SHIELD | Source: Ambulatory Visit | Attending: Obstetrics and Gynecology | Admitting: Obstetrics and Gynecology

## 2018-11-24 ENCOUNTER — Encounter (HOSPITAL_COMMUNITY): Payer: Self-pay | Admitting: *Deleted

## 2018-11-24 DIAGNOSIS — O26892 Other specified pregnancy related conditions, second trimester: Secondary | ICD-10-CM | POA: Diagnosis not present

## 2018-11-24 DIAGNOSIS — R0981 Nasal congestion: Secondary | ICD-10-CM | POA: Diagnosis not present

## 2018-11-24 DIAGNOSIS — R51 Headache: Secondary | ICD-10-CM | POA: Diagnosis not present

## 2018-11-24 DIAGNOSIS — J029 Acute pharyngitis, unspecified: Secondary | ICD-10-CM | POA: Insufficient documentation

## 2018-11-24 DIAGNOSIS — J0101 Acute recurrent maxillary sinusitis: Secondary | ICD-10-CM | POA: Diagnosis not present

## 2018-11-24 DIAGNOSIS — O99512 Diseases of the respiratory system complicating pregnancy, second trimester: Secondary | ICD-10-CM | POA: Insufficient documentation

## 2018-11-24 DIAGNOSIS — R519 Headache, unspecified: Secondary | ICD-10-CM

## 2018-11-24 DIAGNOSIS — Z3A15 15 weeks gestation of pregnancy: Secondary | ICD-10-CM | POA: Diagnosis not present

## 2018-11-24 DIAGNOSIS — R6889 Other general symptoms and signs: Secondary | ICD-10-CM | POA: Diagnosis not present

## 2018-11-24 LAB — COMPREHENSIVE METABOLIC PANEL
ALK PHOS: 50 U/L (ref 38–126)
ALT: 24 U/L (ref 0–44)
AST: 23 U/L (ref 15–41)
Albumin: 3.8 g/dL (ref 3.5–5.0)
Anion gap: 10 (ref 5–15)
BUN: 6 mg/dL (ref 6–20)
CALCIUM: 9 mg/dL (ref 8.9–10.3)
CO2: 22 mmol/L (ref 22–32)
Chloride: 100 mmol/L (ref 98–111)
Creatinine, Ser: 0.57 mg/dL (ref 0.44–1.00)
GFR calc Af Amer: >60 mL/min (ref >60)
GFR calc non Af Amer: >60 mL/min (ref >60)
GLUCOSE: 96 mg/dL (ref 70–99)
Potassium: 4 mmol/L (ref 3.5–5.1)
Sodium: 132 mmol/L — ABNORMAL LOW (ref 135–145)
Total Bilirubin: 0.8 mg/dL (ref 0.3–1.2)
Total Protein: 7.2 g/dL (ref 6.5–8.1)

## 2018-11-24 LAB — CBC WITH DIFFERENTIAL/PLATELET
Basophils Absolute: 0 10*3/uL (ref 0.0–0.1)
Basophils Relative: 0 %
Eosinophils Absolute: 0 10*3/uL (ref 0.0–0.5)
Eosinophils Relative: 0 %
HCT: 39.4 % (ref 36.0–46.0)
HEMOGLOBIN: 13.4 g/dL (ref 12.0–15.0)
Lymphocytes Relative: 12 %
Lymphs Abs: 1 10*3/uL (ref 0.7–4.0)
MCH: 31.5 pg (ref 26.0–34.0)
MCHC: 34 g/dL (ref 30.0–36.0)
MCV: 92.5 fL (ref 80.0–100.0)
Monocytes Absolute: 0.8 10*3/uL (ref 0.1–1.0)
Monocytes Relative: 10 %
NEUTROS ABS: 6.6 10*3/uL (ref 1.7–7.7)
Neutrophils Relative %: 78 %
Platelets: 155 10*3/uL (ref 150–400)
RBC: 4.26 MIL/uL (ref 3.87–5.11)
RDW: 12.1 % (ref 11.5–15.5)
WBC: 8.5 10*3/uL (ref 4.0–10.5)
nRBC: 0 % (ref 0.0–0.2)

## 2018-11-24 LAB — INFLUENZA PANEL BY PCR (TYPE A & B)
Influenza A By PCR: POSITIVE — AB
Influenza B By PCR: NEGATIVE

## 2018-11-24 LAB — URINALYSIS, ROUTINE W REFLEX MICROSCOPIC
Bilirubin Urine: NEGATIVE
Glucose, UA: NEGATIVE mg/dL
HGB URINE DIPSTICK: NEGATIVE
Ketones, ur: 15 mg/dL — AB
Nitrite: NEGATIVE
Protein, ur: NEGATIVE mg/dL
Specific Gravity, Urine: 1.025 (ref 1.005–1.030)
pH: 6 (ref 5.0–8.0)

## 2018-11-24 LAB — URINALYSIS, MICROSCOPIC (REFLEX)

## 2018-11-24 MED ORDER — OSELTAMIVIR PHOSPHATE 75 MG PO CAPS: 75.0000 mg | ORAL_CAPSULE | Freq: Two times a day (BID) | ORAL | 0 refills | Status: AC

## 2018-11-24 MED ORDER — IBUPROFEN 600 MG PO TABS
600.0000 mg | ORAL_TABLET | Freq: Once | ORAL | Status: AC
  Administered 2018-11-24: 600 mg via ORAL
  Filled 2018-11-24: qty 1

## 2018-11-24 MED ORDER — OSELTAMIVIR PHOSPHATE 75 MG PO CAPS
75.0000 mg | ORAL_CAPSULE | Freq: Once | ORAL | Status: AC
  Administered 2018-11-25: 75 mg via ORAL
  Filled 2018-11-24: qty 1

## 2018-11-24 MED ORDER — DEXTROSE 5 % IN LACTATED RINGERS IV BOLUS
1000.0000 mL | Freq: Once | INTRAVENOUS | Status: AC
  Administered 2018-11-24: 1000 mL via INTRAVENOUS

## 2018-11-24 MED ORDER — AMOXICILLIN-POT CLAVULANATE 875-125 MG PO TABS: 1.0000 | ORAL_TABLET | Freq: Two times a day (BID) | ORAL | 0 refills | Status: DC

## 2018-11-24 NOTE — MAU Note (Signed)
Has had cough, congestion, fever for several days. Today unable to keep down anything. No diarrhea.

## 2018-11-24 NOTE — Discharge Instructions (Signed)

## 2018-11-24 NOTE — MAU Note (Signed)
Spoke with Sharen Counter CNM. Flu swab obtained in Triage and pt to lobby to wait for [facility] room. Pt has on mask

## 2018-11-24 NOTE — MAU Provider Note (Signed)
History     CSN: [id number]  Arrival date and time: 11/24/18 9024   First Provider Initiated Contact with Patient 11/24/18 2145      Chief Complaint  Patient presents with  . Nasal Congestion  . Tachycardia  . Fever  . Emesis During Pregnancy   HPI    Ms.Kaitlyn Nguyen is a 28 y.o. female here with sore throat, low grade temp and headache that started on Thursday. She only had the sore throeat for 1 day and it went away. Today she felt worse, felt like she could not get out of bed due to headache and over all just feeling sick. She says the symptoms started 1-2 weeks ago with sinus pressure, headache and congestion. She said the symptoms never fully went away. On Thursday she felt the symptoms worsened. The highest her temp got was 100.5. Says she laid in bed most of today because she felt bad. Her watch at home continued to alarm stating that her heart rate was elevated. She states she has not been able to keep a lot of fluids down due to nausea from post nasal drip.   OB History    Gravida  1   Para  0   Term  0   Preterm  0   AB  0   Living  0     SAB  0   TAB  0   Ectopic  0   Multiple  0   Live Births              Past Medical History:  Diagnosis Date  . Anxiety   . Carpal tunnel syndrome     Past Surgical History:  Procedure Laterality Date  . NO PAST SURGERIES      Family History  Problem Relation Age of Onset  . Hypertension Father   . Diabetes Father   . Diabetes Maternal Grandfather   . Heart attack Maternal Grandfather   . Heart attack Paternal Grandfather     Social History   Tobacco Use  . Smoking status: Never Smoker  . Smokeless tobacco: Never Used  Substance Use Topics  . Alcohol use: Not Currently  . Drug use: No    Allergies: No Known Allergies  Medications Prior to Admission  Medication Sig Dispense Refill Last Dose  . Doxylamine-Pyridoxine 10-10 MG TBEC 2 tabs at night on days 1 and 2.  Then can add one  tab in am on day 3.  Can then add one tab midday on day 4. 120 tablet 0 11/23/2018 at Unknown time  . Prenatal Vit-Fe Fumarate-FA (MULTIVITAMIN-PRENATAL) 27-0.8 MG TABS tablet Take 1 tablet by mouth daily at 12 noon.   11/24/2018 at Unknown time   Results for orders placed or performed during the hospital encounter of 11/24/18 (from the past 48 hour(s))  Urinalysis, Routine w reflex microscopic     Status: Abnormal   Collection Time: 11/24/18  7:48 PM  Result Value Ref Range   Color, Urine YELLOW YELLOW   APPearance CLEAR CLEAR   Specific Gravity, Urine 1.025 1.005 - 1.030   pH 6.0 5.0 - 8.0   Glucose, UA NEGATIVE NEGATIVE mg/dL   Hgb urine dipstick NEGATIVE NEGATIVE   Bilirubin Urine NEGATIVE NEGATIVE   Ketones, ur 15 (A) NEGATIVE mg/dL   Protein, ur NEGATIVE NEGATIVE mg/dL   Nitrite NEGATIVE NEGATIVE   Leukocytes,Ua SMALL (A) NEGATIVE    Comment: Performed at [facility], [address], [place], [place] [address]  Urinalysis, Microscopic (reflex)     Status: Abnormal   Collection Time: 11/24/18  7:48 PM  Result Value Ref Range   RBC / HPF 0-5 0 - 5 RBC/hpf   WBC, UA 0-5 0 - 5 WBC/hpf   Bacteria, UA FEW (A) NONE SEEN   Squamous Epithelial / LPF 0-5 0 - 5    Comment: Performed at [facility], [address], [place], [place] [address]  Influenza panel by PCR (type A & B)     Status: Abnormal   Collection Time: 11/24/18  7:55 PM  Result Value Ref Range   Influenza A By PCR POSITIVE (A) NEGATIVE   Influenza B By PCR NEGATIVE NEGATIVE    Comment: (NOTE) The Xpert Xpress Flu assay is intended as an aid in the diagnosis of  influenza and should not be used as a sole basis for treatment.  This  assay is FDA approved for nasopharyngeal swab specimens only. Nasal  washings and aspirates are unacceptable for Xpert Xpress Flu testing. Performed at [facility], [address], [place], [place] [address]   CBC with Differential     Status: None   Collection Time: 11/24/18  10:05 PM  Result Value Ref Range   WBC 8.5 4.0 - 10.5 K/uL   RBC 4.26 3.87 - 5.11 MIL/uL   Hemoglobin 13.4 12.0 - 15.0 g/dL   HCT 38.3 29.1 - 91.6 %   MCV 92.5 80.0 - 100.0 fL   MCH 31.5 26.0 - 34.0 pg   MCHC 34.0 30.0 - 36.0 g/dL   RDW 60.6 00.4 - 59.9 %   Platelets 155 150 - 400 K/uL   nRBC 0.0 0.0 - 0.2 %   Neutrophils Relative % 78 %   Neutro Abs 6.6 1.7 - 7.7 K/uL   Lymphocytes Relative 12 %   Lymphs Abs 1.0 0.7 - 4.0 K/uL   Monocytes Relative 10 %   Monocytes Absolute 0.8 0.1 - 1.0 K/uL   Eosinophils Relative 0 %   Eosinophils Absolute 0.0 0.0 - 0.5 K/uL   Basophils Relative 0 %   Basophils Absolute 0.0 0.0 - 0.1 K/uL    Comment: Performed at [facility], [address], [place], [place] [address]  Comprehensive metabolic panel     Status: Abnormal   Collection Time: 11/24/18 10:05 PM  Result Value Ref Range   Sodium 132 (L) 135 - 145 mmol/L   Potassium 4.0 3.5 - 5.1 mmol/L   Chloride 100 98 - 111 mmol/L   CO2 22 22 - 32 mmol/L   Glucose, Bld 96 70 - 99 mg/dL   BUN 6 6 - 20 mg/dL   Creatinine, Ser 2.39 0.44 - 1.00 mg/dL   Calcium 9.0 8.9 - 53.2 mg/dL   Total Protein 7.2 6.5 - 8.1 g/dL   Albumin 3.8 3.5 - 5.0 g/dL   AST 23 15 - 41 U/L   ALT 24 0 - 44 U/L   Alkaline Phosphatase 50 38 - 126 U/L   Total Bilirubin 0.8 0.3 - 1.2 mg/dL   GFR calc non Af Amer >60 >60 mL/min   GFR calc Af Amer >60 >60 mL/min   Anion gap 10 5 - 15    Comment: Performed at [facility], [address], [place], [place] [address]    Review of Systems  Constitutional: Positive for fatigue and fever.  HENT: Positive for postnasal drip, rhinorrhea, sinus pressure and sinus pain. Negative for sore throat.   Respiratory: Negative for shortness  of breath.    Physical Exam   Blood pressure 120/71, pulse (!) 105, temperature 98.9 F (37.2 C), resp. rate 18, height 5\' 8"  (1.727 m), weight 71.2 kg, last menstrual period 08/08/2018, SpO2 99 %.  Physical Exam  Constitutional: She  appears well-developed and well-nourished. She has a sickly appearance. No distress.  HENT:  Nose: Right sinus exhibits maxillary sinus tenderness and frontal sinus tenderness. Left sinus exhibits maxillary sinus tenderness and frontal sinus tenderness.  Eyes: Pupils are equal, round, and reactive to light.  Cardiovascular: Tachycardia present.  Respiratory: Effort normal and breath sounds normal. No respiratory distress. She has no wheezes. She has no rales. She exhibits no tenderness.  Musculoskeletal: Normal range of motion.  Skin: She is not diaphoretic.   MAU Course  Procedures  MDM  Hr 130's on arrival, HR down to low 100's at discharge  D5LR bolus X 1 Ibuprofen given 600 mg given PO  Patient says she feels much better following fluids and ibuprofen She declines nausea medications Influenza swab pending CBC and CMP normal  Tamiflu started in MAU 75 mg PO X 1 dose    Assessment and Plan   A:  1. Influenza-like symptoms   2. Headache around the eyes   3. [redacted] weeks gestation of pregnancy   4. Acute recurrent maxillary sinusitis      P:  Discharge home with strict return precautions Rx: Tamiflu, augementin Increase oral fluid intake REST Ok to alternate ibuprofen and tylenol in the 2nd trimester  Rasch, Harolyn RutherfordJennifer I, NP 11/25/2018 8:53 PM

## 2018-12-17 DIAGNOSIS — Z3689 Encounter for other specified antenatal screening: Secondary | ICD-10-CM | POA: Diagnosis not present

## 2018-12-17 DIAGNOSIS — Z3A18 18 weeks gestation of pregnancy: Secondary | ICD-10-CM | POA: Diagnosis not present

## 2019-02-08 DIAGNOSIS — Z3689 Encounter for other specified antenatal screening: Secondary | ICD-10-CM | POA: Diagnosis not present

## 2019-03-01 DIAGNOSIS — Z23 Encounter for immunization: Secondary | ICD-10-CM | POA: Diagnosis not present

## 2019-04-17 DIAGNOSIS — L298 Other pruritus: Secondary | ICD-10-CM | POA: Diagnosis not present

## 2019-04-17 DIAGNOSIS — Z3685 Encounter for antenatal screening for Streptococcus B: Secondary | ICD-10-CM | POA: Diagnosis not present

## 2019-04-18 DIAGNOSIS — Z3685 Encounter for antenatal screening for Streptococcus B: Secondary | ICD-10-CM | POA: Diagnosis not present

## 2019-04-18 LAB — OB RESULTS CONSOLE GBS: GBS: NEGATIVE

## 2019-05-02 ENCOUNTER — Inpatient Hospital Stay (HOSPITAL_COMMUNITY): Payer: BC Managed Care – PPO | Admitting: Anesthesiology

## 2019-05-02 ENCOUNTER — Other Ambulatory Visit: Payer: Self-pay

## 2019-05-02 ENCOUNTER — Encounter (HOSPITAL_COMMUNITY): Payer: Self-pay | Admitting: *Deleted

## 2019-05-02 ENCOUNTER — Inpatient Hospital Stay (HOSPITAL_COMMUNITY)
Admission: AD | Admit: 2019-05-02 | Discharge: 2019-05-05 | DRG: 807 | Disposition: A | Discharge status: Home / Self Care | Payer: BC Managed Care – PPO | Attending: Obstetrics and Gynecology | Admitting: Obstetrics and Gynecology

## 2019-05-02 DIAGNOSIS — Z3A38 38 weeks gestation of pregnancy: Secondary | ICD-10-CM | POA: Diagnosis not present

## 2019-05-02 DIAGNOSIS — O26893 Other specified pregnancy related conditions, third trimester: Secondary | ICD-10-CM | POA: Diagnosis not present

## 2019-05-02 DIAGNOSIS — Z20828 Contact with and (suspected) exposure to other viral communicable diseases: Secondary | ICD-10-CM | POA: Diagnosis present

## 2019-05-02 LAB — ABO/RH: ABO/RH(D): O POS

## 2019-05-02 LAB — CBC
HCT: 38.7 % (ref 36.0–46.0)
Hemoglobin: 13.5 g/dL (ref 12.0–15.0)
MCH: 32.7 pg (ref 26.0–34.0)
MCHC: 34.9 g/dL (ref 30.0–36.0)
MCV: 93.7 fL (ref 80.0–100.0)
Platelets: 146 10*3/uL — ABNORMAL LOW (ref 150–400)
RBC: 4.13 MIL/uL (ref 3.87–5.11)
RDW: 12 % (ref 11.5–15.5)
WBC: 11.3 10*3/uL — ABNORMAL HIGH (ref 4.0–10.5)
nRBC: 0 % (ref 0.0–0.2)

## 2019-05-02 LAB — TYPE AND SCREEN
ABO/RH(D): O POS
Antibody Screen: NEGATIVE

## 2019-05-02 LAB — SARS CORONAVIRUS 2 BY RT PCR (HOSPITAL ORDER, PERFORMED IN ~~LOC~~ HOSPITAL LAB): SARS Coronavirus 2: NEGATIVE

## 2019-05-02 MED ORDER — PHENYLEPHRINE 40 MCG/ML (10ML) SYRINGE FOR IV PUSH (FOR BLOOD PRESSURE SUPPORT): 80.0000 ug | PREFILLED_SYRINGE | INTRAVENOUS | Status: DC | PRN

## 2019-05-02 MED ORDER — LACTATED RINGERS IV SOLN
500.0000 mL | Freq: Once | INTRAVENOUS | Status: AC
  Administered 2019-05-02: 18:00:00 500 mL via INTRAVENOUS

## 2019-05-02 MED ORDER — FENTANYL-BUPIVACAINE-NACL 0.5-0.125-0.9 MG/250ML-% EP SOLN
12.0000 mL/h | EPIDURAL | Status: DC | PRN
  Filled 2019-05-02: qty 250

## 2019-05-02 MED ORDER — SOD CITRATE-CITRIC ACID 500-334 MG/5ML PO SOLN: 30.0000 mL | ORAL | Status: DC | PRN

## 2019-05-02 MED ORDER — OXYTOCIN 40 UNITS IN NORMAL SALINE INFUSION - SIMPLE MED
2.5000 [IU]/h | INTRAVENOUS | Status: DC
  Administered 2019-05-03: 03:00:00 2.5 [IU]/h via INTRAVENOUS
  Filled 2019-05-02: qty 1000

## 2019-05-02 MED ORDER — EPHEDRINE 5 MG/ML INJ: 10.0000 mg | INTRAVENOUS | Status: DC | PRN

## 2019-05-02 MED ORDER — OXYTOCIN BOLUS FROM INFUSION: 500.0000 mL | Freq: Once | INTRAVENOUS | Status: DC

## 2019-05-02 MED ORDER — LIDOCAINE-EPINEPHRINE (PF) 2 %-1:200000 IJ SOLN
INTRAMUSCULAR | Status: DC | PRN
  Administered 2019-05-02 (×2): 3 mL via EPIDURAL

## 2019-05-02 MED ORDER — LACTATED RINGERS IV SOLN
INTRAVENOUS | Status: DC
  Administered 2019-05-02 (×2): via INTRAVENOUS

## 2019-05-02 MED ORDER — ONDANSETRON HCL 4 MG/2ML IJ SOLN: 4.0000 mg | Freq: Four times a day (QID) | INTRAMUSCULAR | Status: DC | PRN

## 2019-05-02 MED ORDER — ACETAMINOPHEN 325 MG PO TABS: 650.0000 mg | ORAL_TABLET | ORAL | Status: DC | PRN

## 2019-05-02 MED ORDER — OXYCODONE-ACETAMINOPHEN 5-325 MG PO TABS: 1.0000 | ORAL_TABLET | ORAL | Status: DC | PRN

## 2019-05-02 MED ORDER — SODIUM CHLORIDE (PF) 0.9 % IJ SOLN
INTRAMUSCULAR | Status: DC | PRN
  Administered 2019-05-02: 12 mL/h via EPIDURAL

## 2019-05-02 MED ORDER — LIDOCAINE HCL (PF) 1 % IJ SOLN: 30.0000 mL | INTRAMUSCULAR | Status: DC | PRN

## 2019-05-02 MED ORDER — OXYCODONE-ACETAMINOPHEN 5-325 MG PO TABS: 2.0000 | ORAL_TABLET | ORAL | Status: DC | PRN

## 2019-05-02 MED ORDER — LACTATED RINGERS IV SOLN: 500.0000 mL | INTRAVENOUS | Status: DC | PRN

## 2019-05-02 MED ORDER — DIPHENHYDRAMINE HCL 50 MG/ML IJ SOLN: 12.5000 mg | INTRAMUSCULAR | Status: DC | PRN

## 2019-05-02 NOTE — Progress Notes (Signed)
Patient ID: Kaitlyn Nguyen, female   DOB: Oct 02, 1991, 28 y.o.   MRN: [id number] Pt comfortable with epidural VSS EFM - cat 1 , 135 TOCO - 2-61mins SVE 6-7cm dil/100/-1  A/P: AROM performed with clear amniotic fluid noted         Expectant mgmt         Augment with pitocin if indicated

## 2019-05-02 NOTE — Anesthesia Preprocedure Evaluation (Signed)

## 2019-05-02 NOTE — H&P (Signed)
Kaitlyn Nguyen is a 28 y.o.prime female presenting at  59 1/7wks in active labor. Pt was 4cm dilated in MAU on arrival and has now progressed spontaneously to 6cm. Pt is dated per LMP which was confirmed with a 7week [place]. She had a benign prenatal course. She is GBS negative. She declined carrier testing; neg first trimester screen OB History    Gravida  1   Para  0   Term  0   Preterm  0   AB  0   Living  0     SAB  0   TAB  0   Ectopic  0   Multiple  0   Live Births             Past Medical History:  Diagnosis Date  . Anxiety   . Carpal tunnel syndrome    carpal tunnel   Past Surgical History:  Procedure Laterality Date  . NO PAST SURGERIES     Family History: family history includes Diabetes in her father and maternal grandfather; Heart attack in her maternal grandfather and paternal grandfather; Hypertension in her father. Social History:  reports that she has never smoked. She has never used smokeless tobacco. She reports previous alcohol use. She reports that she does not use drugs.     Maternal Diabetes: No Genetic Screening: Normal Maternal Ultrasounds/Referrals: Normal Fetal Ultrasounds or other Referrals:  None Maternal Substance Abuse:  No Significant Maternal Medications:  None Significant Maternal Lab Results:  Group B Strep negative Other Comments:  None  Review of Systems  Constitutional: Positive for malaise/fatigue. Negative for chills, fever and weight loss.  Eyes: Negative for blurred vision and double vision.  Cardiovascular: Negative for chest pain.  Gastrointestinal: Positive for abdominal pain. Negative for heartburn, nausea and vomiting.  Genitourinary: Negative for dysuria.  Musculoskeletal: Positive for back pain. Negative for myalgias.  Skin: Negative for itching and rash.  Neurological: Negative for dizziness and headaches.  Endo/Heme/Allergies: Negative for environmental allergies. Does not bruise/bleed easily.   Psychiatric/Behavioral: Negative for depression, hallucinations, substance abuse and suicidal ideas. The patient is nervous/anxious.    Maternal Medical History:  Reason for admission: Contractions.  Nausea.  Contractions: Onset was 6-12 hours ago.   Frequency: regular.   Perceived severity is strong.    Fetal activity: Perceived fetal activity is normal.   Last perceived fetal movement was within the past hour.    Prenatal complications: no prenatal complications Prenatal Complications - Diabetes: none.    Dilation: 6 Effacement (%): 100 Station: -1 Exam by:: Dr. Terri Piedra Blood pressure 126/81, pulse 96, temperature 98.9 F (37.2 C), temperature source Axillary, resp. rate 16, height 5\' 8"  (1.727 m), weight 84.8 kg, last menstrual period 08/08/2018. Maternal Exam:  Uterine Assessment: Contraction strength is firm.  Contraction frequency is regular.   Abdomen: Patient reports generalized tenderness.  Estimated fetal weight is AGA.   Fetal presentation: vertex  Introitus: Normal vulva. Vulva is negative for condylomata.  Normal vagina.  Pelvis: adequate for delivery.      Fetal Exam Fetal Monitor Review: Baseline rate: 135.  Variability: moderate (6-25 bpm).   Pattern: accelerations present and no decelerations.    Fetal State Assessment: Category I - tracings are normal.     Physical Exam  Constitutional: She is oriented to person, place, and time. She appears well-developed and well-nourished.  Neck: Normal range of motion.  Cardiovascular: Normal rate.  Respiratory: Effort normal.  GI: Soft. There is generalized abdominal tenderness.  Genitourinary:    Vulva, vagina and uterus normal.     No vulval condylomata noted.   Musculoskeletal: Normal range of motion.  Neurological: She is alert and oriented to person, place, and time.  Skin: Skin is warm.  Psychiatric: She has a normal mood and affect. Her behavior is normal. Judgment and thought content normal.     Prenatal labs: ABO, Rh: --/--/O POS, O POS Performed at [facility], [address] [place], [place] [address]  [phone](07/30 1300) Antibody: NEG (07/30 1300) Rubella: Immune (01/10 0000) RPR: Nonreactive (01/10 0000)  HBsAg: Negative (01/10 0000)  HIV: Non-reactive (01/10 0000)  GBS: Negative (07/16 0000)   Assessment/Plan: 27yo G1P0 at 7838 1/[redacted]wks gestation in active spontaneous labor Expectant mgmt  GBS negative Epidural now per pt AROM after epidural Anticipate svd    W  05/02/2019, 5:37 PM

## 2019-05-02 NOTE — MAU Note (Signed)
Pt reports ctx since yesterday. Got worse last night now 4-6 min apart. Reports some bloody show denies any leaking . Good fetal movement felt.

## 2019-05-02 NOTE — Progress Notes (Signed)
Left message via voice mail.

## 2019-05-02 NOTE — Anesthesia Procedure Notes (Signed)
Epidural Patient location during procedure: OB Start time: 05/02/2019 5:45 PM End time: 05/02/2019 6:00 PM  Staffing Anesthesiologist: Freddrick March, MD Performed: anesthesiologist   Preanesthetic Checklist Completed: patient identified, pre-op evaluation, timeout performed, IV checked, risks and benefits discussed and monitors and equipment checked  Epidural Patient position: sitting Prep: site prepped and draped and DuraPrep Patient monitoring: continuous pulse ox, blood pressure, heart rate and cardiac monitor Approach: midline Location: L3-L4 Injection technique: LOR air  Needle:  Needle type: Tuohy  Needle gauge: 17 G Needle length: 9 cm Needle insertion depth: 5 cm Catheter type: closed end flexible Catheter size: 19 Gauge Catheter at skin depth: 10 cm Test dose: negative  Assessment Sensory level: T8 Events: blood not aspirated, injection not painful, no injection resistance, negative IV test and no paresthesia  Additional Notes Patient identified. Risks/Benefits/Options discussed with patient including but not limited to bleeding, infection, nerve damage, paralysis, failed block, incomplete pain control, headache, blood pressure changes, nausea, vomiting, reactions to medication both or allergic, itching and postpartum back pain. Confirmed with bedside nurse the patient's most recent platelet count. Confirmed with patient that they are not currently taking any anticoagulation, have any bleeding history or any family history of bleeding disorders. Patient expressed understanding and wished to proceed. All questions were answered. Sterile technique was used throughout the entire procedure. Please see nursing notes for vital signs. Test dose was given through epidural catheter and negative prior to continuing to dose epidural or start infusion. Warning signs of high block given to the patient including shortness of breath, tingling/numbness in hands, complete motor block,  or any concerning symptoms with instructions to call for help. Patient was given instructions on fall risk and not to get out of bed. All questions and concerns addressed with instructions to call with any issues or inadequate analgesia.  Reason for block:procedure for pain

## 2019-05-03 ENCOUNTER — Encounter (HOSPITAL_COMMUNITY): Payer: Self-pay | Admitting: Obstetrics and Gynecology

## 2019-05-03 LAB — CBC
HCT: 34.8 % — ABNORMAL LOW (ref 36.0–46.0)
HCT: 34 % — ABNORMAL LOW (ref 36.0–46.0)
Hemoglobin: 12 g/dL (ref 12.0–15.0)
Hemoglobin: 11.9 g/dL — ABNORMAL LOW (ref 12.0–15.0)
MCH: 32.5 pg (ref 26.0–34.0)
MCH: 32.7 pg (ref 26.0–34.0)
MCHC: 34.5 g/dL (ref 30.0–36.0)
MCHC: 35 g/dL (ref 30.0–36.0)
MCV: 94.3 fL (ref 80.0–100.0)
MCV: 93.4 fL (ref 80.0–100.0)
Platelets: 122 10*3/uL — ABNORMAL LOW (ref 150–400)
Platelets: 136 10*3/uL — ABNORMAL LOW (ref 150–400)
RBC: 3.69 MIL/uL — ABNORMAL LOW (ref 3.87–5.11)
RBC: 3.64 MIL/uL — ABNORMAL LOW (ref 3.87–5.11)
RDW: 11.9 % (ref 11.5–15.5)
RDW: 11.9 % (ref 11.5–15.5)
WBC: 15.9 10*3/uL — ABNORMAL HIGH (ref 4.0–10.5)
WBC: 18.4 10*3/uL — ABNORMAL HIGH (ref 4.0–10.5)
nRBC: 0 % (ref 0.0–0.2)
nRBC: 0 % (ref 0.0–0.2)

## 2019-05-03 LAB — RPR: RPR Ser Ql: NONREACTIVE

## 2019-05-03 MED ORDER — BENZOCAINE-MENTHOL 20-0.5 % EX AERO
1.0000 "application " | INHALATION_SPRAY | CUTANEOUS | Status: DC | PRN
  Administered 2019-05-03: 1 via TOPICAL
  Filled 2019-05-03: qty 56

## 2019-05-03 MED ORDER — IBUPROFEN 600 MG PO TABS
600.0000 mg | ORAL_TABLET | Freq: Four times a day (QID) | ORAL | Status: DC
  Administered 2019-05-03 – 2019-05-05 (×11): 600 mg via ORAL
  Filled 2019-05-03 (×11): qty 1

## 2019-05-03 MED ORDER — DIBUCAINE (PERIANAL) 1 % EX OINT: 1.0000 "application " | TOPICAL_OINTMENT | CUTANEOUS | Status: DC | PRN

## 2019-05-03 MED ORDER — OXYCODONE HCL 5 MG PO TABS: 10.0000 mg | ORAL_TABLET | ORAL | Status: DC | PRN

## 2019-05-03 MED ORDER — ONDANSETRON HCL 4 MG/2ML IJ SOLN: 4.0000 mg | INTRAMUSCULAR | Status: DC | PRN

## 2019-05-03 MED ORDER — DIPHENHYDRAMINE HCL 25 MG PO CAPS: 25.0000 mg | ORAL_CAPSULE | Freq: Four times a day (QID) | ORAL | Status: DC | PRN

## 2019-05-03 MED ORDER — SIMETHICONE 80 MG PO CHEW: 80.0000 mg | CHEWABLE_TABLET | ORAL | Status: DC | PRN

## 2019-05-03 MED ORDER — PRENATAL MULTIVITAMIN CH
1.0000 | ORAL_TABLET | Freq: Every day | ORAL | Status: DC
  Administered 2019-05-03 – 2019-05-05 (×3): 1 via ORAL
  Filled 2019-05-03 (×3): qty 1

## 2019-05-03 MED ORDER — COCONUT OIL OIL
1.0000 "application " | TOPICAL_OIL | Status: DC | PRN
  Administered 2019-05-04: 1 via TOPICAL

## 2019-05-03 MED ORDER — WITCH HAZEL-GLYCERIN EX PADS
1.0000 "application " | MEDICATED_PAD | CUTANEOUS | Status: DC | PRN
  Administered 2019-05-03: 1 via TOPICAL

## 2019-05-03 MED ORDER — ONDANSETRON HCL 4 MG PO TABS: 4.0000 mg | ORAL_TABLET | ORAL | Status: DC | PRN

## 2019-05-03 MED ORDER — SENNOSIDES-DOCUSATE SODIUM 8.6-50 MG PO TABS
2.0000 | ORAL_TABLET | ORAL | Status: DC
  Administered 2019-05-03 – 2019-05-05 (×2): 2 via ORAL
  Filled 2019-05-03 (×2): qty 2

## 2019-05-03 MED ORDER — ZOLPIDEM TARTRATE 5 MG PO TABS: 5.0000 mg | ORAL_TABLET | Freq: Every evening | ORAL | Status: DC | PRN

## 2019-05-03 MED ORDER — ACETAMINOPHEN 325 MG PO TABS: 650.0000 mg | ORAL_TABLET | ORAL | Status: DC | PRN

## 2019-05-03 MED ORDER — TETANUS-DIPHTH-ACELL PERTUSSIS 5-2.5-18.5 LF-MCG/0.5 IM SUSP: 0.5000 mL | Freq: Once | INTRAMUSCULAR | Status: DC

## 2019-05-03 MED ORDER — OXYCODONE HCL 5 MG PO TABS: 5.0000 mg | ORAL_TABLET | ORAL | Status: DC | PRN

## 2019-05-03 NOTE — Progress Notes (Signed)
CSW received consult for hx of Anxiety.  CSW met with MOB to offer support and complete assessment.    CSW entered the room and congratulated MOB on the birth of infant Normand Sloop). MOB was advised of the reason for the visit. MOB reports that she was diagnosed with anxiety in 2018. MOB reported that she was placed on PRN meds but are no longer taking them. MOB reported that she was placed in therapy at that time but is no longer in that either. MOB reports that she has the ability to re-enter therapy if needed. MOB declined any other mental health diagnosis.   CSW provided education regarding the baby blues period vs. perinatal mood disorders, discussed treatment and gave resources for mental health follow up if concerns arise.  CSW recommends self-evaluation during the postpartum time period using the New Mom Checklist from Postpartum Progress and encouraged MOB to contact a medical professional if symptoms are noted at any time.   CSW provided review of Sudden Infant Death Syndrome (SIDS) precautions.    MOB reports that she has support from her family during this time. MOB notified CSW that she has all needed items to care for infant with no further needs at this time.    CSW identifies no further need for intervention and no barriers to discharge at this time.     Kaitlyn Nguyen, MSW, LCSW [facility] [place] [phone]

## 2019-05-03 NOTE — Anesthesia Postprocedure Evaluation (Signed)
Anesthesia Post Note  Patient: Kaitlyn Nguyen  Procedure(s) Performed: AN AD [place]     Patient location during evaluation: Mother Baby Anesthesia Type: Epidural Level of consciousness: awake and alert Pain management: pain level controlled Vital Signs Assessment: post-procedure vital signs reviewed and stable Respiratory status: spontaneous breathing Cardiovascular status: stable Postop Assessment: no headache, adequate PO intake, patient able to bend at knees, no backache, epidural receding and no apparent nausea or vomiting Anesthetic complications: no    Last Vitals:  Vitals:   05/03/19 0430 05/03/19 0535  BP: 113/72 107/74  Pulse: (!) 107 90  Resp: 17 18  Temp: 36.8 C 37 C  SpO2: 100%     Last Pain:  Vitals:   05/03/19 0720  TempSrc:   PainSc: 0-No pain   Pain Goal:                   [name]

## 2019-05-03 NOTE — Anesthesia Postprocedure Evaluation (Signed)
Anesthesia Post Note  Patient: Jannie Doyle  Procedure(s) Performed: AN AD [place]     Patient location during evaluation: Mother Baby Anesthesia Type: Epidural Level of consciousness: awake and alert Pain management: pain level controlled Vital Signs Assessment: post-procedure vital signs reviewed and stable Respiratory status: spontaneous breathing Cardiovascular status: stable Postop Assessment: no headache, adequate PO intake, no backache, patient able to bend at knees, able to ambulate, epidural receding and no apparent nausea or vomiting Anesthetic complications: no    Last Vitals:  Vitals:   05/03/19 0430 05/03/19 0535  BP: 113/72 107/74  Pulse: (!) 107 90  Resp: 17 18  Temp: 36.8 C 37 C  SpO2: 100%     Last Pain:  Vitals:   05/03/19 0720  TempSrc:   PainSc: 0-No pain   Pain Goal:                   [name]

## 2019-05-03 NOTE — Lactation Note (Signed)
This note was copied from a baby's chart. Lactation Consultation Note  Patient Name: Kaitlyn Nguyen [id number] Date: 05/03/2019 Reason for consult: 1st time breastfeeding;Early term 37-38.6wks  P1, 15 hour ETI female infant. Per mom, she has a Spectra 2 DEBP that she brought with from home. Infant had two voids, one LC changed while in room and two stools. Per mom this is her third time latching infant to breast, mom also made two attempts earlier but infant not interested in feeding.  Infant was given 5 ml of colostrum by spoon after mom breastfeed infant. Mom was given breast shells to wear in bra during the day and hand pump to pre-pump prior to latching infant to breast due being short shafted and breast compresses inward when touched. [place] asked mom to do breast stimulation and hand express a little colostrum prior to latching infant to breast, mom latched infant on left breast using cross cradle  hold and then football hold mom switch position while feeding same breast. Infant eventually breastfeed for 15 minutes in rthymitic pattern Mom knows to breastfeed according hunger cues, 8 to 12 times within 24 hours and on demand. Mom knows to ask Nurse or [facility] for assistance with latching infant to breast if needed. Reviewed Baby & Me book's Breastfeeding Basics.  Mom made aware of O/P services, breastfeeding support groups, community resources, and our phone # for post-discharge questions.  Mom's plan: 1. Breastfeed infant according hunger cues, 8 to 12 times within 24 hours . 2. Mom will pre-pump with hand pump  or do breast stimulation prior to latching infant to breast.  3. Mom will wear breast shells in bra during the day and not sleep in them at night. 4. Parents will do as much STS as possible.  Maternal Data Formula Feeding for Exclusion: No Has patient been taught Hand Expression?: Yes(Infant given 5 ml of colostrum after latching to breast.) Does the patient have breastfeeding  experience prior to this delivery?: No  Feeding Feeding Type: Breast Fed  LATCH Score Latch: Grasps breast easily, tongue down, lips flanged, rhythmical sucking.  Audible Swallowing: A few with stimulation  Type of Nipple: Everted at rest and after stimulation(slightly short shaft and nipple compress inward when touched.)  Comfort (Breast/Nipple): Soft / non-tender  Hold (Positioning): Assistance needed to correctly position infant at breast and maintain latch.  LATCH Score: 8  Interventions Interventions: Breast feeding basics reviewed;Breast compression;Assisted with latch;Adjust position;Skin to skin;Support pillows;Breast massage;Position options;Hand express;Expressed milk;Coconut oil;Shells;Hand pump  Lactation Tools Discussed/Used Tools: Shells;Pump Shell Type: (slightly short shafted) Breast pump type: Manual WIC Program: No   Consult Status Consult Status: Follow-up Date: 05/04/19 Follow-up type: In-patient    [name] 05/03/2019, 5:34 PM

## 2019-05-03 NOTE — Progress Notes (Signed)
PPD #0 No problems Afeb, VSS Fundus firm, NT at U-1 Continue routine postpartum care 

## 2019-05-03 NOTE — Progress Notes (Signed)
Patient ID: Kaitlyn Nguyen, female   DOB: [birth year], 28 y.o.   MRN: [id number] Pt with pelvic pressure with contractions and urge to push VSS CAT 1 Contractions q 3-56mins C/c/0  A/P: Prime at term; completely dilated         Will begin second stage pushing

## 2019-05-03 NOTE — Progress Notes (Signed)
CSW went to speak with MOB at bedside regarding  further needs. RN in room caring for MOB and infant at this time. CSW to try back later.      Ailey Wessling S. Jakaiya Netherland, MSW, LCSW [facility] [place] [phone]   

## 2019-05-04 NOTE — Lactation Note (Signed)
This note was copied from a baby's chart. Lactation Consultation Note  Patient Name: Kaitlyn Nguyen POEUM'P Date: 05/04/2019 Reason for consult: Follow-up assessment;Mother's request;Primapara;1st time breastfeeding  2040 - 2101 - I followed up with Ms. Gencarelli upon request. Gunnar Fusi was beginning to cue. I observed Ms. Witty latch baby in football hold on her right breast. Baby latched quickly with rhythmical suckling, and Ms. Tindall denied breast pain. I coached mom and dad at the bedside in how to gently pester baby to stay awake at the breast. Baby fed about 7 minutes and then fell asleep and released the breast.  We placed Ellie skin to skin on mom's chest, and baby immediately started to root toward's mom's left breast. We moved her to cross cradle hold. I showed mom how to hold the breast in a U hold, and baby latched with wide mouth. She occasionally tugged back on the breast, which did cause Ms. Rio some pain. She seemed less relaxed in this hold, and eventually I directed Ms. Loughmiller to break suction and put her back on the left breast in football hold. Baby latched and fed a few moments and then went to sleep.  We reviewed day 2 infant feeding patterns, ways to prevent pain with latch, and I encouraged Ms. Eastham to follow her feeding cues throughout the night.   Feeding Feeding Type: Breast Fed  LATCH Score Latch: Grasps breast easily, tongue down, lips flanged, rhythmical sucking.  Audible Swallowing: A few with stimulation  Type of Nipple: Everted at rest and after stimulation  Comfort (Breast/Nipple): Soft / non-tender  Hold (Positioning): Assistance needed to correctly position infant at breast and maintain latch.  LATCH Score: 8  Interventions Interventions: Breast feeding basics reviewed;Assisted with latch;Skin to skin;Breast massage;Hand express;Adjust position;Support pillows;Position options  Lactation Tools Discussed/Used     Consult  Status Consult Status: Follow-up Date: 05/05/19 Follow-up type: In-patient    Lenore Manner 05/04/2019, 9:12 PM

## 2019-05-04 NOTE — Plan of Care (Signed)
  Problem: Education: Goal: Knowledge of General Education information will improve Description: Including pain rating scale, medication(s)/side effects and non-pharmacologic comfort measures Outcome: Completed/Met   Problem: Clinical Measurements: Goal: Ability to maintain clinical measurements within normal limits will improve Outcome: Completed/Met Goal: Will remain free from infection Outcome: Completed/Met Goal: Diagnostic test results will improve Outcome: Completed/Met Goal: Cardiovascular complication will be avoided Outcome: Completed/Met   Problem: Activity: Goal: Risk for activity intolerance will decrease Outcome: Completed/Met   Problem: Nutrition: Goal: Adequate nutrition will be maintained Outcome: Completed/Met   Problem: Elimination: Goal: Will not experience complications related to bowel motility Outcome: Completed/Met Goal: Will not experience complications related to urinary retention Outcome: Completed/Met   Problem: Pain Managment: Goal: General experience of comfort will improve Outcome: Completed/Met   Problem: Safety: Goal: Ability to remain free from injury will improve Outcome: Completed/Met   Problem: Skin Integrity: Goal: Risk for impaired skin integrity will decrease Outcome: Completed/Met   Problem: Education: Goal: Knowledge of condition will improve Outcome: Completed/Met   Problem: Activity: Goal: Will verbalize the importance of balancing activity with adequate rest periods Outcome: Completed/Met   Problem: Life Cycle: Goal: Chance of risk for complications during the postpartum period will decrease Outcome: Completed/Met   Problem: Role Relationship: Goal: Ability to demonstrate positive interaction with newborn will improve Outcome: Completed/Met   Problem: Skin Integrity: Goal: Demonstration of wound healing without infection will improve Outcome: Completed/Met

## 2019-05-04 NOTE — Lactation Note (Signed)
This note was copied from a baby's chart. Lactation Consultation Note  Patient Name: Kaitlyn Nguyen Date: 05/04/2019 Reason for consult: Follow-up assessment;1st time breastfeeding;Early term 37-38.6wks;Primapara(RN request)  1549 - 1605 - RN requested that I follow up with Kaitlyn Nguyen. She is a P1, and she has had some concerns about her daughter, Kaitlyn Nguyen, eating enough. When I walking into the room, her significant other was holding Eliie in blankets, and baby was asleep and calm. Mom states that Kaitlyn Nguyen just ate for about 48 minutes, but that she becomes sleepy at th breast and needs lots of pestering to stay awake. Baby also has had some bouts of crying, which mom perceives to be hunger.  Per mom, baby slept quite a bit in the first 24 hours. I discussed normal infant feeding patterns in day 2 and recommended mom and dad hold Kaitlyn Nguyen skin to skin as much as possible and allow her to feed upon demand. We reviewed hand expression. Kaitlyn Nguyen has a good amount of colostrum that expresses. I spoon fed Kaitlyn Nguyen about 1/2 a spoonful of colostrum. Her nipples were in tact and WNL.   I recommended mom hand express 5-7 minutes between feedings and feed back any EBM. I provided an additional spoon. Mom has a manual pump set up in the room. We discussed purpose of the manual pump to help elongate her nipples and to stimulate the breasts, and we discussed how HE may yield more colostrum at this time.  Kaitlyn Nguyen and her significant other were very receptive to education. I encouraged her to call later for a feeding observation/assistance, and she verbalized understanding.    Maternal Data Has patient been taught Hand Expression?: Yes Does the patient have breastfeeding experience prior to this delivery?: No  Feeding Feeding Type: Breast Milk  LATCH Score                   Interventions Interventions: Breast feeding basics reviewed;Hand express;Breast massage  Lactation Tools  Discussed/Used Tools: Other (comment)(spoon) Breast pump type: Manual;Other (comment)(Personal pump in the room)   Consult Status Consult Status: Follow-up Date: 05/04/19 Follow-up type: In-patient    Lenore Manner 05/04/2019, 4:29 PM

## 2019-05-04 NOTE — Progress Notes (Signed)
PPD #1 No problems Afeb, VSS Fundus firm, NT at U-1 Continue routine postpartum care 

## 2019-05-05 MED ORDER — IBUPROFEN 600 MG PO TABS: 600.0000 mg | ORAL_TABLET | Freq: Four times a day (QID) | ORAL | 0 refills | Status: DC | PRN

## 2019-05-05 NOTE — Progress Notes (Signed)
PPD #2 Doing well, felt strange last night but better today Afeb, VSS Fundus firm D/c home

## 2019-05-05 NOTE — Discharge Instructions (Signed)
As per discharge pamphlet °

## 2019-05-05 NOTE — Plan of Care (Signed)
  Problem: Education: Goal: Individualized Newborn Educational Video(s) Outcome: Completed/Met   Problem: Activity: Goal: Ability to tolerate increased activity will improve Outcome: Completed/Met

## 2019-05-05 NOTE — Lactation Note (Signed)
This note was copied from a baby's chart. Lactation Consultation Note  Patient Name: Kaitlyn Nguyen [id number] Date: 05/05/2019 Reason for consult: Follow-up assessment;Infant weight loss;Primapara Baby is 54 hours old/9% weight loss.  No output in the past 12 hours.  Mom states baby was fussy and cluster fed during the night.  Baby often becomes sleepy early into the feeding.  Mom has done some hand expression and given a few mls to baby.  Discussed importance of giving extra calories due to weight loss.  Symphony pump set up and initiated.  Mom obtained 7 mls of transitional milk.  Baby syringe fed expressed milk with curved tip syringe.  Tolerated well.  Baby relaxed and content after feeding.  Mom will continue to breastfeed with cues and post pump every 2 hours giving baby expressed milk.  Maternal Data    Feeding Feeding Type: (P) Breast Fed  LATCH Score                   Interventions    Lactation Tools Discussed/Used Pump Review: Setup, frequency, and cleaning;Milk Storage Initiated by:: LMoulden Date initiated:: 05/05/19   Consult Status      Carla Drape S 05/05/2019, 8:32 AM

## 2019-05-05 NOTE — Discharge Summary (Signed)
OB Discharge Summary     Patient Name: Kaitlyn Nguyen DOB: 15-Oct-1990 MRN: [id number]  Date of admission: 05/02/2019 Delivering MD: Carlynn Purl [facility]   Date of discharge: 05/05/2019  Admitting diagnosis: CTX 4 TO 6MINS Intrauterine pregnancy: [redacted]w[redacted]d     Secondary diagnosis:  Active Problems:   Normal labor   SVD (spontaneous vaginal delivery)      Discharge diagnosis: Term Pregnancy [place] [facility] course:  Onset of Labor With Vaginal Delivery     28 y.o. yo G1P1001 at [redacted]w[redacted]d was admitted in Active Labor on 05/02/2019. Patient had an uncomplicated labor course as follows:  Membrane Rupture Time/Date: 6:21 PM ,05/02/2019   Intrapartum Procedures: Episiotomy: None [1]                                         Lacerations:  2nd degree [3]  Patient had a delivery of a Viable infant. 05/03/2019  Information for the patient's newborn:  Kaitlyn, Nguyen [[id number]]  Delivery Method: Vaginal, Spontaneous(Filed from Delivery Summary)     Pateint had an uncomplicated postpartum course.  She is ambulating, tolerating a regular diet, passing flatus, and urinating well. Patient is discharged home in stable condition on 05/05/19.   Physical exam  Vitals:   05/04/19 1527 05/04/19 2146 05/05/19 0249 05/05/19 0608  BP:  103/63 118/87 101/70  Pulse: 86 77 96 72  Resp:  18 18 18   Temp:  98.6 F (37 C) 98.7 F (37.1 C) 97.9 F (36.6 C)  TempSrc:  Oral Oral Oral  SpO2: 99% 99%    Weight:      Height:       General: alert Lochia: appropriate Uterine Fundus: firm  Labs: Lab Results  Component Value Date   WBC 18.4 (H) 05/03/2019   HGB 11.9 (L) 05/03/2019   HCT 34.0 (L) 05/03/2019   MCV 93.4 05/03/2019   PLT 136 (L) 05/03/2019   CMP Latest Ref Rng & Units 11/24/2018  Glucose 70 - 99 mg/dL 96  BUN 6 - 20 mg/dL 6  Creatinine 0.44 - 1.00 mg/dL 0.57  Sodium 135 - 145 mmol/L 132(L)  Potassium 3.5 - 5.1 mmol/L 4.0  Chloride 98 - 111  mmol/L 100  CO2 22 - 32 mmol/L 22  Calcium 8.9 - 10.3 mg/dL 9.0  Total Protein 6.5 - 8.1 g/dL 7.2  Total Bilirubin 0.3 - 1.2 mg/dL 0.8  Alkaline Phos 38 - 126 U/L 50  AST 15 - 41 U/L 23  ALT 0 - 44 U/L 24    Discharge instruction: per After Visit Summary and "Baby and Me Booklet".  After visit meds:  Allergies as of 05/05/2019   No Known Allergies     Medication List    STOP taking these medications   amoxicillin-clavulanate 875-125 MG tablet Commonly known as: AUGMENTIN   Doxylamine-Pyridoxine 10-10 MG Tbec     TAKE these medications   calcium carbonate 1250 (500 Ca) MG chewable tablet Commonly known as: OS-CAL Chew 1 tablet by mouth daily.   ibuprofen 600 MG tablet Commonly known as: ADVIL Take 1 tablet (600 mg total) by mouth every 6 (six) hours as needed.   multivitamin-prenatal 27-0.8 MG Tabs tablet Take 1  tablet by mouth daily at 12 noon.       Diet: routine diet  Activity: Advance as tolerated. Pelvic rest for 6 weeks.   Outpatient follow up:6 weeks  Newborn Data: Live born female  Birth Weight: 7 lb 3.2 oz (3266 g) APGAR: 8, 9  Newborn Delivery   Birth date/time: 05/03/2019 02:02:00 Delivery type: Vaginal, Spontaneous      Baby Feeding: Breast Disposition:home with mother   05/05/2019 Kaitlyn Nieceodd D Kaitlyn Skidmore, MD

## 2019-06-14 DIAGNOSIS — Z1389 Encounter for screening for other disorder: Secondary | ICD-10-CM | POA: Diagnosis not present

## 2019-06-14 DIAGNOSIS — Z3009 Encounter for other general counseling and advice on contraception: Secondary | ICD-10-CM | POA: Diagnosis not present

## 2019-10-07 DIAGNOSIS — R309 Painful micturition, unspecified: Secondary | ICD-10-CM | POA: Diagnosis not present

## 2019-12-16 ENCOUNTER — Encounter: Payer: Self-pay | Admitting: Certified Nurse Midwife

## 2019-12-18 ENCOUNTER — Encounter: Payer: Self-pay | Admitting: Certified Nurse Midwife

## 2020-04-25 DIAGNOSIS — S0502XA Injury of conjunctiva and corneal abrasion without foreign body, left eye, initial encounter: Secondary | ICD-10-CM | POA: Diagnosis not present

## 2020-05-11 DIAGNOSIS — W57XXXA Bitten or stung by nonvenomous insect and other nonvenomous arthropods, initial encounter: Secondary | ICD-10-CM | POA: Diagnosis not present

## 2020-05-11 DIAGNOSIS — S40861A Insect bite (nonvenomous) of right upper arm, initial encounter: Secondary | ICD-10-CM | POA: Diagnosis not present

## 2020-05-14 DIAGNOSIS — A6920 Lyme disease, unspecified: Secondary | ICD-10-CM | POA: Diagnosis not present

## 2020-07-10 DIAGNOSIS — Z20822 Contact with and (suspected) exposure to covid-19: Secondary | ICD-10-CM | POA: Diagnosis not present

## 2020-07-13 DIAGNOSIS — Z20822 Contact with and (suspected) exposure to covid-19: Secondary | ICD-10-CM | POA: Diagnosis not present

## 2020-10-03 NOTE — L&D Delivery Note (Signed)
Delivery Note Pt labored well till completed. She then pushed for about and at 1:42 AM a viable female was delivered via  (Presentation:   Occiput Anterior).  APGAR: 8, 9; weight  pending.   Anterior and posterior shoulders delivered with next two pushes after delivery of vertex; body easily followed.  Cord was clamped and cut by FOB after a minute delay. Infant was placed on mothers abdomen. Grunting was noted thus bulb suction was performed; when this did not resolve, baby was taken to the warmer and delee suction attempted - scant return. Pulse ox noted improvement after being in warmer thus baby was returned to mothers chest.   Placenta status: Spontaneous, Intact.  Tomasa Blase. Cord: 3 vessels with the following complications: None.  Cord pH: n/a  Anesthesia: Epidural Episiotomy: None Lacerations: first degree Suture Repair: vicryl rapide 4-0 Est. Blood Loss (mL): 853  Mom to postpartum.  Baby to Couplet care / Skin to Skin They desire circumcision for baby.  Cathrine Muster 05/26/2021, 2:22 AM

## 2020-10-08 DIAGNOSIS — Z23 Encounter for immunization: Secondary | ICD-10-CM | POA: Diagnosis not present

## 2020-10-08 DIAGNOSIS — Z3201 Encounter for pregnancy test, result positive: Secondary | ICD-10-CM | POA: Diagnosis not present

## 2020-10-08 DIAGNOSIS — N911 Secondary amenorrhea: Secondary | ICD-10-CM | POA: Diagnosis not present

## 2020-10-08 DIAGNOSIS — O219 Vomiting of pregnancy, unspecified: Secondary | ICD-10-CM | POA: Diagnosis not present

## 2020-11-05 DIAGNOSIS — O26891 Other specified pregnancy related conditions, first trimester: Secondary | ICD-10-CM | POA: Diagnosis not present

## 2020-11-05 DIAGNOSIS — Z113 Encounter for screening for infections with a predominantly sexual mode of transmission: Secondary | ICD-10-CM | POA: Diagnosis not present

## 2020-11-05 DIAGNOSIS — Z3A10 10 weeks gestation of pregnancy: Secondary | ICD-10-CM | POA: Diagnosis not present

## 2020-11-05 DIAGNOSIS — Z124 Encounter for screening for malignant neoplasm of cervix: Secondary | ICD-10-CM | POA: Diagnosis not present

## 2020-11-05 DIAGNOSIS — Z1151 Encounter for screening for human papillomavirus (HPV): Secondary | ICD-10-CM | POA: Diagnosis not present

## 2020-11-05 DIAGNOSIS — Z3481 Encounter for supervision of other normal pregnancy, first trimester: Secondary | ICD-10-CM | POA: Diagnosis not present

## 2020-11-05 DIAGNOSIS — Z369 Encounter for antenatal screening, unspecified: Secondary | ICD-10-CM | POA: Diagnosis not present

## 2020-11-05 LAB — OB RESULTS CONSOLE HEPATITIS B SURFACE ANTIGEN: Hepatitis B Surface Ag: NEGATIVE

## 2020-11-05 LAB — OB RESULTS CONSOLE RUBELLA ANTIBODY, IGM: Rubella: IMMUNE

## 2020-11-05 LAB — OB RESULTS CONSOLE GC/CHLAMYDIA
Chlamydia: NEGATIVE
Gonorrhea: NEGATIVE

## 2020-11-05 LAB — OB RESULTS CONSOLE VARICELLA ZOSTER ANTIBODY, IGG: Varicella: IMMUNE

## 2021-01-12 DIAGNOSIS — Z363 Encounter for antenatal screening for malformations: Secondary | ICD-10-CM | POA: Diagnosis not present

## 2021-01-12 DIAGNOSIS — Z3A20 20 weeks gestation of pregnancy: Secondary | ICD-10-CM | POA: Diagnosis not present

## 2021-02-01 DIAGNOSIS — Z362 Encounter for other antenatal screening follow-up: Secondary | ICD-10-CM | POA: Diagnosis not present

## 2021-02-01 DIAGNOSIS — Z3A23 23 weeks gestation of pregnancy: Secondary | ICD-10-CM | POA: Diagnosis not present

## 2021-03-02 DIAGNOSIS — R82998 Other abnormal findings in urine: Secondary | ICD-10-CM | POA: Diagnosis not present

## 2021-03-02 DIAGNOSIS — Z3689 Encounter for other specified antenatal screening: Secondary | ICD-10-CM | POA: Diagnosis not present

## 2021-03-02 DIAGNOSIS — Z23 Encounter for immunization: Secondary | ICD-10-CM | POA: Diagnosis not present

## 2021-03-02 LAB — OB RESULTS CONSOLE RPR: RPR: NONREACTIVE

## 2021-03-02 LAB — OB RESULTS CONSOLE HIV ANTIBODY (ROUTINE TESTING): HIV: NONREACTIVE

## 2021-04-14 DIAGNOSIS — O365930 Maternal care for other known or suspected poor fetal growth, third trimester, not applicable or unspecified: Secondary | ICD-10-CM | POA: Diagnosis not present

## 2021-04-14 DIAGNOSIS — O99513 Diseases of the respiratory system complicating pregnancy, third trimester: Secondary | ICD-10-CM | POA: Diagnosis not present

## 2021-04-14 DIAGNOSIS — Z3A32 32 weeks gestation of pregnancy: Secondary | ICD-10-CM | POA: Diagnosis not present

## 2021-04-23 DIAGNOSIS — Z20822 Contact with and (suspected) exposure to covid-19: Secondary | ICD-10-CM | POA: Diagnosis not present

## 2021-04-27 DIAGNOSIS — Z3A00 Weeks of gestation of pregnancy not specified: Secondary | ICD-10-CM | POA: Diagnosis not present

## 2021-04-27 DIAGNOSIS — Z3400 Encounter for supervision of normal first pregnancy, unspecified trimester: Secondary | ICD-10-CM | POA: Diagnosis not present

## 2021-05-03 DIAGNOSIS — M5489 Other dorsalgia: Secondary | ICD-10-CM | POA: Diagnosis not present

## 2021-05-13 LAB — OB RESULTS CONSOLE GBS: GBS: NEGATIVE

## 2021-05-25 ENCOUNTER — Inpatient Hospital Stay (HOSPITAL_COMMUNITY): Payer: 59 | Admitting: Anesthesiology

## 2021-05-25 ENCOUNTER — Inpatient Hospital Stay (HOSPITAL_COMMUNITY)
Admission: AD | Admit: 2021-05-25 | Discharge: 2021-05-28 | DRG: 807 | Disposition: A | Discharge status: Home / Self Care | Payer: 59 | Attending: Obstetrics and Gynecology | Admitting: Obstetrics and Gynecology

## 2021-05-25 ENCOUNTER — Encounter (HOSPITAL_COMMUNITY): Payer: Self-pay

## 2021-05-25 DIAGNOSIS — Z8616 Personal history of COVID-19: Secondary | ICD-10-CM

## 2021-05-25 DIAGNOSIS — Z20822 Contact with and (suspected) exposure to covid-19: Secondary | ICD-10-CM | POA: Diagnosis present

## 2021-05-25 DIAGNOSIS — O26893 Other specified pregnancy related conditions, third trimester: Secondary | ICD-10-CM | POA: Diagnosis present

## 2021-05-25 DIAGNOSIS — Z3A39 39 weeks gestation of pregnancy: Secondary | ICD-10-CM

## 2021-05-25 LAB — CBC
HCT: 41.8 % (ref 36.0–46.0)
Hemoglobin: 14.4 g/dL (ref 12.0–15.0)
MCH: 32.2 pg (ref 26.0–34.0)
MCHC: 34.4 g/dL (ref 30.0–36.0)
MCV: 93.5 fL (ref 80.0–100.0)
Platelets: 168 10*3/uL (ref 150–400)
RBC: 4.47 MIL/uL (ref 3.87–5.11)
RDW: 11.9 % (ref 11.5–15.5)
WBC: 15.3 10*3/uL — ABNORMAL HIGH (ref 4.0–10.5)
nRBC: 0 % (ref 0.0–0.2)

## 2021-05-25 LAB — RESP PANEL BY RT-PCR (FLU A&B, COVID) ARPGX2
Influenza A by PCR: NEGATIVE
Influenza B by PCR: NEGATIVE
SARS Coronavirus 2 by RT PCR: NEGATIVE

## 2021-05-25 LAB — TYPE AND SCREEN
ABO/RH(D): O POS
Antibody Screen: NEGATIVE

## 2021-05-25 MED ORDER — LIDOCAINE-EPINEPHRINE (PF) 1.5 %-1:200000 IJ SOLN
INTRAMUSCULAR | Status: DC | PRN
  Administered 2021-05-25: 3 mL via EPIDURAL

## 2021-05-25 MED ORDER — LIDOCAINE HCL (PF) 1 % IJ SOLN: 30.0000 mL | INTRAMUSCULAR | Status: DC | PRN

## 2021-05-25 MED ORDER — LACTATED RINGERS IV SOLN: 500.0000 mL | INTRAVENOUS | Status: DC | PRN

## 2021-05-25 MED ORDER — PHENYLEPHRINE 40 MCG/ML (10ML) SYRINGE FOR IV PUSH (FOR BLOOD PRESSURE SUPPORT): 80.0000 ug | PREFILLED_SYRINGE | INTRAVENOUS | Status: DC | PRN

## 2021-05-25 MED ORDER — OXYCODONE-ACETAMINOPHEN 5-325 MG PO TABS: 1.0000 | ORAL_TABLET | ORAL | Status: DC | PRN

## 2021-05-25 MED ORDER — LACTATED RINGERS IV SOLN: INTRAVENOUS | Status: DC

## 2021-05-25 MED ORDER — OXYTOCIN BOLUS FROM INFUSION
333.0000 mL | Freq: Once | INTRAVENOUS | Status: AC
  Administered 2021-05-26: 333 mL via INTRAVENOUS

## 2021-05-25 MED ORDER — LACTATED RINGERS IV SOLN
500.0000 mL | Freq: Once | INTRAVENOUS | Status: AC
  Administered 2021-05-25: 500 mL via INTRAVENOUS

## 2021-05-25 MED ORDER — ACETAMINOPHEN 325 MG PO TABS: 650.0000 mg | ORAL_TABLET | ORAL | Status: DC | PRN

## 2021-05-25 MED ORDER — ONDANSETRON HCL 4 MG/2ML IJ SOLN: 4.0000 mg | Freq: Four times a day (QID) | INTRAMUSCULAR | Status: DC | PRN

## 2021-05-25 MED ORDER — FENTANYL-BUPIVACAINE-NACL 0.5-0.125-0.9 MG/250ML-% EP SOLN
12.0000 mL/h | EPIDURAL | Status: DC | PRN
  Administered 2021-05-25: 12 mL/h via EPIDURAL
  Filled 2021-05-25: qty 250

## 2021-05-25 MED ORDER — OXYTOCIN-SODIUM CHLORIDE 30-0.9 UT/500ML-% IV SOLN
2.5000 [IU]/h | INTRAVENOUS | Status: DC
  Filled 2021-05-25: qty 500

## 2021-05-25 MED ORDER — EPHEDRINE 5 MG/ML INJ: 10.0000 mg | INTRAVENOUS | Status: DC | PRN

## 2021-05-25 MED ORDER — FLEET ENEMA 7-19 GM/118ML RE ENEM: 1.0000 | ENEMA | RECTAL | Status: DC | PRN

## 2021-05-25 MED ORDER — OXYCODONE-ACETAMINOPHEN 5-325 MG PO TABS: 2.0000 | ORAL_TABLET | ORAL | Status: DC | PRN

## 2021-05-25 MED ORDER — DIPHENHYDRAMINE HCL 50 MG/ML IJ SOLN
12.5000 mg | INTRAMUSCULAR | Status: DC | PRN
Start: 2021-05-25 — End: 2021-05-26

## 2021-05-25 MED ORDER — LIDOCAINE HCL (PF) 1 % IJ SOLN
INTRAMUSCULAR | Status: DC | PRN
  Administered 2021-05-25: 3 mL via EPIDURAL

## 2021-05-25 MED ORDER — SOD CITRATE-CITRIC ACID 500-334 MG/5ML PO SOLN: 30.0000 mL | ORAL | Status: DC | PRN

## 2021-05-25 NOTE — MAU Note (Signed)
Pt arrived to MAU in active labor. Pt is 8cm. FHR 140 expedited transfer to l&d.   Dr. Mindi Slicker made aware.

## 2021-05-25 NOTE — Anesthesia Preprocedure Evaluation (Signed)
Anesthesia Evaluation  Patient identified by MRN, date of birth, ID band Patient awake    Reviewed: Allergy & Precautions, NPO status , Patient's Chart, lab work & pertinent test results  Airway Mallampati: II  TM Distance: >3 FB Neck ROM: Full    Dental no notable dental hx.    Pulmonary neg pulmonary ROS,    Pulmonary exam normal breath sounds clear to auscultation       Cardiovascular negative cardio ROS Normal cardiovascular exam Rhythm:Regular Rate:Normal     Neuro/Psych negative neurological ROS  negative psych ROS   GI/Hepatic negative GI ROS, Neg liver ROS,   Endo/Other  negative endocrine ROS  Renal/GU negative Renal ROS  negative genitourinary   Musculoskeletal negative musculoskeletal ROS (+)   Abdominal   Peds  Hematology negative hematology ROS (+)   Anesthesia Other Findings   Reproductive/Obstetrics (+) Pregnancy                             Anesthesia Physical  Anesthesia Plan  ASA: 2  Anesthesia Plan: Epidural   Post-op Pain Management:    Induction:   PONV Risk Score and Plan:   Airway Management Planned:   Additional Equipment:   Intra-op Plan:   Post-operative Plan:   Informed Consent: I have reviewed the patients History and Physical, chart, labs and discussed the procedure including the risks, benefits and alternatives for the proposed anesthesia with the patient or authorized representative who has indicated his/her understanding and acceptance.       Plan Discussed with:   Anesthesia Plan Comments:         Anesthesia Quick Evaluation

## 2021-05-25 NOTE — Anesthesia Procedure Notes (Signed)
Epidural Patient location during procedure: OB Start time: 05/25/2021 9:27 PM End time: 05/25/2021 9:46 PM  Staffing Anesthesiologist: Lewie Loron, MD Performed: anesthesiologist   Preanesthetic Checklist Completed: patient identified, IV checked, risks and benefits discussed, monitors and equipment checked, pre-op evaluation and timeout performed  Epidural Patient position: sitting Prep: DuraPrep and site prepped and draped Patient monitoring: heart rate, continuous pulse ox and blood pressure Approach: midline Location: L2-L3 Injection technique: LOR air and LOR saline  Needle:  Needle type: Tuohy  Needle gauge: 17 G Needle length: 9 cm Needle insertion depth: 6 cm Catheter type: closed end flexible Catheter size: 19 Gauge Catheter at skin depth: 12 cm Test dose: negative  Assessment Sensory level: T8 Events: blood not aspirated, injection not painful, no injection resistance, no paresthesia and negative IV test  Additional Notes Reason for block:procedure for pain

## 2021-05-25 NOTE — MAU Note (Signed)
Pt reports ctx's that started at 2100.   Denies LOF.   Reports +FM

## 2021-05-25 NOTE — H&P (Signed)
Kaitlyn Nguyen is a 30 y.o.G2P1001 female presenting in active labor. Pt reports contractions all day. On arrival in MAU, pt noted to be 8cm dilated.  She is dated per LMP which was confirmed with a 10week [place]. Her pregnancy has been relatively uncomplicated. Had covid in first trimester - not vaccinated. Recently had unrelenting cough that caused costrochondritis. She is GBS negative. She declined genetic and carrier screening. She desires an epidural . OB History     Gravida  2   Para  1   Term  1   Preterm  0   AB  0   Living  1      SAB  0   IAB  0   Ectopic  0   Multiple  0   Live Births  1          Past Medical History:  Diagnosis Date   Anxiety    Carpal tunnel syndrome    carpal tunnel   SVD (spontaneous vaginal delivery) 05/03/2019   Past Surgical History:  Procedure Laterality Date   NO PAST SURGERIES     Family History: family history includes Diabetes in her father and maternal grandfather; Heart attack in her maternal grandfather and paternal grandfather; Hypertension in her father. Social History:  reports that she has never smoked. She has never used smokeless tobacco. She reports that she does not currently use alcohol. She reports that she does not use drugs.     Maternal Diabetes: No Genetic Screening: Declined Maternal Ultrasounds/Referrals: Normal Fetal Ultrasounds or other Referrals:  None Maternal Substance Abuse:  No Significant Maternal Medications:  None Significant Maternal Lab Results:  Group B Strep negative Other Comments:  None  Review of Systems  Constitutional:  Positive for activity change. Negative for fatigue.  Eyes:  Negative for photophobia and visual disturbance.  Respiratory:  Positive for cough. Negative for chest tightness and shortness of breath.   Cardiovascular:  Positive for chest pain. Negative for palpitations and leg swelling.  Gastrointestinal:  Positive for abdominal pain.  Genitourinary:  Positive  for pelvic pain.  Musculoskeletal:  Positive for back pain.  Neurological:  Negative for light-headedness and headaches.  Psychiatric/Behavioral:  The patient is nervous/anxious.   Maternal Medical History:  Reason for admission: Contractions.   Contractions: Onset was 6-12 hours ago.   Frequency: regular.   Perceived severity is strong.   Fetal activity: Perceived fetal activity is normal.   Prenatal complications: no prenatal complications Prenatal Complications - Diabetes: none.  Dilation: 8 Effacement (%): 90 Exam by:: Caprice Renshaw, RN unknown if currently breastfeeding. Maternal Exam:  Uterine Assessment: Contraction strength is firm.  Contraction frequency is irregular.  Abdomen: Patient reports generalized tenderness.  Estimated fetal weight is AGA.   Fetal presentation: vertex Introitus: Normal vulva. Vulva is negative for condylomata and lesion.  Normal vagina.  Pelvis: adequate for delivery.   Cervix: Cervix evaluated by digital exam.     Fetal Exam Fetal Monitor Review: Baseline rate: 140.  Variability: moderate (6-25 bpm).   Pattern: accelerations present and no decelerations.   Fetal State Assessment: Category I - tracings are normal.  Physical Exam Vitals and nursing note reviewed. Exam conducted with a chaperone present.  Constitutional:      Appearance: Normal appearance.  Pulmonary:     Effort: Pulmonary effort is normal.  Abdominal:     Tenderness: There is generalized abdominal tenderness.  Genitourinary:    General: Normal vulva.  Vulva is no lesion.  Musculoskeletal:  General: Normal range of motion.     Cervical back: Normal range of motion.  Skin:    General: Skin is warm.     Capillary Refill: Capillary refill takes 2 to 3 seconds.  Neurological:     General: No focal deficit present.     Mental Status: She is alert and oriented to person, place, and time. Mental status is at baseline.  Psychiatric:        Mood and Affect: Mood  normal.        Behavior: Behavior normal.        Thought Content: Thought content normal.        Judgment: Judgment normal.    Prenatal labs: ABO, Rh: --/--/O POS (08/23 1950) Antibody: NEG (08/23 1950) Rubella: Immune (02/03 0000) RPR: Nonreactive (05/31 0000)  HBsAg: Negative (02/03 0000)  HIV: Non-reactive (05/31 0000)  GBS: Negative/-- (08/11 0000)   Assessment/Plan: 64MA [id number] female here in active labor -Admit - Epidural per anesthesia - AROM after epidural; augment with pitocin if indicated - GBS neg - Covid neg - Anticipate svd    Janean Sark Kielan Dreisbach 05/25/2021, 8:49 PM

## 2021-05-25 NOTE — Progress Notes (Signed)
Patient ID: Fontaine Kossman, female   DOB: Feb 18, 1991, 30 y.o.   MRN: [id number] Pt comfortable with epidural. No complaints VSS GEN - NAD EFM - cat 1, 140 TOCO - ctxs q 1-37mins SVE - 8.5/90/-1  Plan: AROM performed with slight meconium stained fluid return          Monitor over next 1-2 hours; augment with pitocin if indicated           Anticipate svd

## 2021-05-26 ENCOUNTER — Encounter (HOSPITAL_COMMUNITY): Payer: Self-pay | Admitting: Obstetrics and Gynecology

## 2021-05-26 LAB — RPR: RPR Ser Ql: NONREACTIVE

## 2021-05-26 MED ORDER — DIPHENHYDRAMINE HCL 25 MG PO CAPS: 25.0000 mg | ORAL_CAPSULE | Freq: Four times a day (QID) | ORAL | Status: DC | PRN

## 2021-05-26 MED ORDER — SIMETHICONE 80 MG PO CHEW: 80.0000 mg | CHEWABLE_TABLET | ORAL | Status: DC | PRN

## 2021-05-26 MED ORDER — ONDANSETRON HCL 4 MG/2ML IJ SOLN: 4.0000 mg | INTRAMUSCULAR | Status: DC | PRN

## 2021-05-26 MED ORDER — DIBUCAINE (PERIANAL) 1 % EX OINT: 1.0000 "application " | TOPICAL_OINTMENT | CUTANEOUS | Status: DC | PRN

## 2021-05-26 MED ORDER — LACTATED RINGERS IV SOLN: INTRAVENOUS | Status: DC

## 2021-05-26 MED ORDER — OXYCODONE HCL 5 MG PO TABS
5.0000 mg | ORAL_TABLET | ORAL | Status: DC | PRN
Start: 2021-05-26 — End: 2021-05-28

## 2021-05-26 MED ORDER — OXYTOCIN-SODIUM CHLORIDE 30-0.9 UT/500ML-% IV SOLN: 2.5000 [IU]/h | INTRAVENOUS | Status: DC | PRN

## 2021-05-26 MED ORDER — TETANUS-DIPHTH-ACELL PERTUSSIS 5-2.5-18.5 LF-MCG/0.5 IM SUSY: 0.5000 mL | PREFILLED_SYRINGE | Freq: Once | INTRAMUSCULAR | Status: DC

## 2021-05-26 MED ORDER — PRENATAL MULTIVITAMIN CH
1.0000 | ORAL_TABLET | Freq: Every day | ORAL | Status: DC
  Administered 2021-05-26 – 2021-05-27 (×2): 1 via ORAL
  Filled 2021-05-26 (×2): qty 1

## 2021-05-26 MED ORDER — WITCH HAZEL-GLYCERIN EX PADS: 1.0000 "application " | MEDICATED_PAD | CUTANEOUS | Status: DC | PRN

## 2021-05-26 MED ORDER — ZOLPIDEM TARTRATE 5 MG PO TABS: 5.0000 mg | ORAL_TABLET | Freq: Every evening | ORAL | Status: DC | PRN

## 2021-05-26 MED ORDER — BENZOCAINE-MENTHOL 20-0.5 % EX AERO
1.0000 "application " | INHALATION_SPRAY | CUTANEOUS | Status: DC | PRN
  Filled 2021-05-26 (×2): qty 56

## 2021-05-26 MED ORDER — OXYCODONE HCL 5 MG PO TABS: 10.0000 mg | ORAL_TABLET | ORAL | Status: DC | PRN

## 2021-05-26 MED ORDER — ONDANSETRON HCL 4 MG PO TABS: 4.0000 mg | ORAL_TABLET | ORAL | Status: DC | PRN

## 2021-05-26 MED ORDER — IBUPROFEN 600 MG PO TABS
600.0000 mg | ORAL_TABLET | Freq: Four times a day (QID) | ORAL | Status: DC
  Administered 2021-05-26 – 2021-05-28 (×8): 600 mg via ORAL
  Filled 2021-05-26 (×8): qty 1

## 2021-05-26 MED ORDER — ACETAMINOPHEN 325 MG PO TABS
650.0000 mg | ORAL_TABLET | ORAL | Status: DC | PRN
  Filled 2021-05-26: qty 2

## 2021-05-26 MED ORDER — SENNOSIDES-DOCUSATE SODIUM 8.6-50 MG PO TABS
2.0000 | ORAL_TABLET | Freq: Every day | ORAL | Status: DC
  Administered 2021-05-27: 2 via ORAL
  Filled 2021-05-26: qty 2

## 2021-05-26 MED ORDER — COCONUT OIL OIL
1.0000 "application " | TOPICAL_OIL | Status: DC | PRN
  Administered 2021-05-26: 1 via TOPICAL

## 2021-05-26 NOTE — Progress Notes (Signed)
Patient ID: Kaitlyn Nguyen, female   DOB: 1991-07-14, 30 y.o.   MRN: [id number] DOD Doing well, just tired   Plans circumcision, d/w pt.

## 2021-05-26 NOTE — Plan of Care (Signed)
Pt admitted to the unit.  Oriented to the room and admission package reviewed to include menu, breastfeeding log, Edinburgh scale and baby safety sheet.  Pt verbalizes understanding.  Instructed to call out when she needs to use the bathroom, as her left leg is still heavy from the epidural.  Pt verbalizes understanding. Pt is resting comfortably with infant skin to skin and spouse at bedside for support.

## 2021-05-26 NOTE — Anesthesia Postprocedure Evaluation (Signed)
Anesthesia Post Note  Patient: Kaitlyn Nguyen  Procedure(s) Performed: AN AD HOC LABOR EPIDURAL     Patient location during evaluation: Mother Baby Anesthesia Type: Epidural Level of consciousness: awake and alert Pain management: pain level controlled Vital Signs Assessment: post-procedure vital signs reviewed and stable Respiratory status: spontaneous breathing Cardiovascular status: stable Postop Assessment: no headache, adequate PO intake, no backache, patient able to bend at knees, able to ambulate, epidural receding and no apparent nausea or vomiting Anesthetic complications: no   No notable events documented.  Last Vitals:  Vitals:   05/26/21 0400 05/26/21 0500  BP: 138/81 116/77  Pulse: (!) 108 (!) 107  Resp: 18 18  Temp: 37.3 C 37.2 C  SpO2: 100% 98%    Last Pain:  Vitals:   05/26/21 0500  TempSrc: Oral  PainSc:    Pain Goal:                   Salome Arnt

## 2021-05-26 NOTE — Lactation Note (Addendum)
This note was copied from a baby's chart. Lactation Consultation Note  Patient Name: Kaitlyn Nguyen [id number] Date: 05/26/2021 Reason for consult: Initial assessment;Nipple pain/trauma;Term Age:32 hours  LC in to visit with P2 Mom of term infant.  Baby in football hold, swaddled in blanket when [facility] entered room.  Mom's first breastfeeding experience wasn't ideal.  Mom states her milk came to volume on day 6, baby had a lip tie and wasn't transferring milk well on the breast.  Baby lost weight and Mom supplemented the entire time she fed baby.  Mom did a lot of pumping during 18 months.  Mom states this baby is more interested in latching to the breast than her first baby.  Offered to assist with positioning baby STS with more elevation to prevent Mom from hunching over baby.   When baby came off the breast,  nipple was creased along the tip.  Unable to identify a lingual frenulum and baby's suck on finger appeared good with adequate tongue cupping.  Reviewed breast massage and hand expression, colostrum easily expressed onto nipple.  Baby unwrapped and placed STS in football hold.  Assisted Mom to support baby's head and bring baby to breast with wide gape of mouth. Showed FOB how to tug on chin to flange lower lip and get a deeper latch.  Baby fed with deep jaw extensions and occasional swallows.  Mom feeling slight discomfort and after baby came off, nipple pinched again.    Coconut oil provided.  DEBP set up and Mom assisted to pump using 21 mm flanges for a good fit currently.  Mom using coconut oil to lubricate nipples and areola.    Mom to call for assistance prn.  Plan- 1- Keep baby STS as much as possible 2-Offer breast with cues, a deep latch asking for help prn 3-Pump both breasts 15 mins on initiation setting to support milk supply 4-Offer any EBM to baby by spoon or syringe.  Lactation brochure provided.  Mom aware of IP and OP lactation support available to her.   Maternal  Data Has patient been taught Hand Expression?: Yes Does the patient have breastfeeding experience prior to this delivery?: Yes How long did the patient breastfeed?: 18 months  Feeding Mother's Current Feeding Choice: Breast Milk  LATCH Score Latch: Grasps breast easily, tongue down, lips flanged, rhythmical sucking.  Audible Swallowing: A few with stimulation  Type of Nipple: Everted at rest and after stimulation  Comfort (Breast/Nipple): Filling, red/small blisters or bruises, mild/mod discomfort  Hold (Positioning): Assistance needed to correctly position infant at breast and maintain latch.  LATCH Score: 7   Lactation Tools Discussed/Used Tools: Pump;Flanges Flange Size: 21 Breast pump type: Double-Electric Breast Pump Pump Education: Setup, frequency, and cleaning;Milk Storage Reason for Pumping: Support milk supply/history of low milk supply Pumping frequency: Encouraged Mom to pump after baby breastfeeds  Interventions Interventions: Breast feeding basics reviewed;Assisted with latch;Skin to skin;Breast massage;Hand express;Breast compression;Adjust position;Support pillows;Position options;Coconut oil;DEBP;Education  Discharge Pump: Personal (Owns a [employer] DEBP) WIC Program: No  Consult Status Consult Status: Follow-up Date: 05/27/21 Follow-up type: In-patient    Judee Clara 05/26/2021, 12:04 PM

## 2021-05-27 LAB — CBC
HCT: 31 % — ABNORMAL LOW (ref 36.0–46.0)
Hemoglobin: 10.4 g/dL — ABNORMAL LOW (ref 12.0–15.0)
MCH: 31.9 pg (ref 26.0–34.0)
MCHC: 33.5 g/dL (ref 30.0–36.0)
MCV: 95.1 fL (ref 80.0–100.0)
Platelets: 140 10*3/uL — ABNORMAL LOW (ref 150–400)
RBC: 3.26 MIL/uL — ABNORMAL LOW (ref 3.87–5.11)
RDW: 12.2 % (ref 11.5–15.5)
WBC: 8.8 10*3/uL (ref 4.0–10.5)
nRBC: 0 % (ref 0.0–0.2)

## 2021-05-27 MED ORDER — IBUPROFEN 600 MG PO TABS: 600.0000 mg | ORAL_TABLET | Freq: Four times a day (QID) | ORAL | 0 refills | Status: DC

## 2021-05-27 NOTE — Discharge Instructions (Signed)
As per discharge pamphlet °

## 2021-05-27 NOTE — Progress Notes (Signed)
PPD #1 No problems, would like to go home Afeb, VSS Fundus firm, NT at U-1 Continue routine postpartum care, d/c home today 

## 2021-05-27 NOTE — Lactation Note (Addendum)
This note was copied from a baby's chart. Lactation Consultation Note  Patient Name: Kaitlyn Nguyen [id number] Date: 05/27/2021 Reason for consult: Follow-up assessment;Term;Difficult latch Age:32 hours   LC Follow Up Consult:  Baby asleep in father's arms when I arrived.  Mother voiced concern regarding continuous pain with breast feeding and states her nipples are flattened after every feeding.  Offered to return at the next feeding to help ascertain the issues mother is experiencing with feedings.  Mother appreciative.  RN updated.   Maternal Data    Feeding Mother's Current Feeding Choice: Breast Milk  LATCH Score Latch: Grasps breast easily, tongue down, lips flanged, rhythmical sucking.  Audible Swallowing: Spontaneous and intermittent  Type of Nipple: Everted at rest and after stimulation  Comfort (Breast/Nipple): Filling, red/small blisters or bruises, mild/mod discomfort  Hold (Positioning): Assistance needed to correctly position infant at breast and maintain latch.  LATCH Score: 8   Lactation Tools Discussed/Used    Interventions Interventions: Assisted with latch;Skin to skin;Hand express;Reverse pressure;Breast compression;Adjust position;Support pillows;Coconut oil  Discharge    Consult Status Consult Status: Follow-up Date: 05/27/21 Follow-up type: In-patient    [name] 05/27/2021, 2:44 PM

## 2021-05-27 NOTE — Progress Notes (Signed)
Baby not getting discharged today so she is staying

## 2021-05-27 NOTE — Lactation Note (Signed)
This note was copied from a baby's chart. Lactation Consultation Note  Patient Name: Kaitlyn Nguyen [id number] Date: 05/27/2021 Reason for consult: Follow-up assessment;Term Age:32 hours   P2 mother whose infant is now 26 hours old.  This is a term baby at 39+5 weeks.  Mother breast fed and pumped for a total of 18 months with her first child.  Mother called for latch assistance as discussed in my earlier consult.  Baby frantic and ready to feed.  Suggested mother express colostrum and spoon feed to help settle baby.  Mother easily expressed colostrum and spoon fed back to baby.  Mother reports continuous pain with breast feeding and a "white line" across her nipple at completion. Assessed baby's oral cavity and did not detect an obvious lip or tongue tie.  Baby able to extend tongue over lower alveolar ridge, cup my finger, elevate tongue and suck rhythmically.   He does have a strong grasp upon my gloved finger.  Reviewed breast feeding basics and assisted to latch in the football hold to the right breast.  Baby with a wide gape and flanged lips.  Good positioning and support with breast feeding.  Baby gassy during feedings and removed from breast a couple of times to burp.  Education completed while observing him feeding for 22 minutes with intermittent swallows.  Mother reported slight pain but "better" than it had been.  Upon completion with feeding, mother had a small white line across nipple tip.  Encouraged usage of EBM, coconut oil and comfort gels for nipple relief.    I am uncertain as to why this continues to happen.  Mother states the sensitivity eased during the feeding but she could still feel slight pain.  Informed her that I do not want her to be in pain and, if needed, a nipple shield could be started.  However, it is not necessary due to nipple issues: mother's nipples evert well and we were able to obtain a deep latch.  Baby fell asleep on mother's chest after feeding.  She  remarked that he has never been able to do this and has been very active and showing cues after the previous feedings.  Explained that he is content now and to continue to call for latch assistance if needed.  I will plan to check in with parents tomorrow on feeding progress.  Father present and helpful.  RN updated.   Maternal Data Has patient been taught Hand Expression?: Yes Does the patient have breastfeeding experience prior to this delivery?: Yes How long did the patient breastfeed?: 18 months: breast/pumping  Feeding Mother's Current Feeding Choice: Breast Milk  LATCH Score Latch: Grasps breast easily, tongue down, lips flanged, rhythmical sucking.  Audible Swallowing: Spontaneous and intermittent  Type of Nipple: Everted at rest and after stimulation (pinched nipple after feeding; no cracks or blisters)  Comfort (Breast/Nipple): Filling, red/small blisters or bruises, mild/mod discomfort  Hold (Positioning): Assistance needed to correctly position infant at breast and maintain latch.  LATCH Score: 8   Lactation Tools Discussed/Used Tools: Pump;Coconut oil;Comfort gels Flange Size: 24;27 Breast pump type: Double-Electric Breast Pump;Manual Reason for Pumping: Breast stimulation Pumping frequency: PRN  Interventions Interventions: Breast feeding basics reviewed;Assisted with latch;Skin to skin;Breast massage;Hand express;Breast compression;Adjust position;DEBP;Hand pump;Comfort gels;Coconut oil;Expressed milk;Position options;Support pillows;Education  Discharge Pump: DEBP;Manual;Personal WIC Program: No  Consult Status Consult Status: Follow-up Date: 05/28/21 Follow-up type: In-patient    Kaitlyn Nguyen R Azreal Stthomas 05/27/2021, 4:10 PM

## 2021-05-28 ENCOUNTER — Inpatient Hospital Stay (HOSPITAL_COMMUNITY): Admit: 2021-05-28 | Payer: Self-pay

## 2021-05-28 NOTE — Progress Notes (Signed)
POSTPARTUM PROGRESS NOTE  Post Partum Day #2  Subjective:  No acute events overnight.  Pt denies problems with ambulating, voiding or po intake.  She denies nausea or vomiting.  Pain is well controlled.   Lochia Minimal.   Objective: Blood pressure 120/90, pulse 72, temperature 98.2 F (36.8 C), temperature source Oral, resp. rate 18, SpO2 100 %, unknown if currently breastfeeding.  Physical Exam:  General: alert, cooperative and no distress Lochia:normal flow Chest: CTAB Heart: RRR no m/r/g Abdomen: +BS, soft, nontender Uterine Fundus: firm, 2cm below umbilicus GU: suture intact, healing well, no purulent drainage Extremities: neg edema, neg calf TTP BL, neg Homans BL  Recent Labs    05/25/21 1954 05/27/21 0719  HGB 14.4 10.4*  HCT 41.8 31.0*    Assessment/Plan:  ASSESSMENT: Kaitlyn Nguyen is a 30 y.o. [id number] s/p SVD @ [redacted]w[redacted]d.   Discharge home and Breastfeeding Baby s/p circ   LOS: 3 days

## 2021-05-28 NOTE — Lactation Note (Signed)
This note was copied from a baby's chart. Lactation Consultation Note  Patient Name: Kaitlyn Nguyen [id number] Date: 05/28/2021 Reason for consult: Follow-up assessment;Term Age:32 hours   P2 mother whose infant is now 44 hours old.  This is a term baby at 39+5 weeks.    Family has been discharged.  Reviewed baby's breast feeding progress since working with this mother/baby couplet yesterday.  Mother happy with baby's progress.  He continues to have a strong suck, however, mother's nipples are without trauma.  She is using EBM/coconut oil and comfort gels for nipple comfort.    Encouraged to continue feeding on cue after discharge.  Reviewed engorgement prevention/treatment.  Parents have noticed different color and consistency with mother's EBM now.  Baby's stools beginning to transition nicely.  Baby has been content after feedings.  Reassurance provided.  Parents work well together and have worked well with their newborn.  They have our OP phone number for any further questions/concerns.  Discussed our OP options if mother desires.  Parents appreciative.   Maternal Data    Feeding Mother's Current Feeding Choice: Breast Milk  LATCH Score                    Lactation Tools Discussed/Used    Interventions Interventions: Education  Discharge Discharge Education: Engorgement and breast care;Warning signs for feeding baby  Consult Status Consult Status: Complete Date: 05/28/21 Follow-up type: Call as needed    Irene Pap Cynethia Schindler 05/28/2021, 11:48 AM

## 2021-05-28 NOTE — Discharge Summary (Signed)
Postpartum Discharge Summary  Date of Service updated     Patient Name: Kaitlyn Nguyen DOB: May 13, 1991 MRN: [id number]  Date of admission: 05/25/2021 Delivery date:05/26/2021  Delivering provider: [facility]  Date of discharge: 05/28/2021  Admitting diagnosis: Normal labor and delivery [O80] Postpartum care following vaginal delivery [Z39.2] Intrauterine pregnancy: [redacted]w[redacted]d     Secondary diagnosis:  Active Problems:   Normal labor and delivery   Vaginal delivery   Postpartum care following vaginal delivery  Additional problems: none    Discharge diagnosis: Term Pregnancy Delivered                                              Post partum procedures: none Augmentation: AROM Complications: None  Hospital course: Onset of Labor With Vaginal Delivery      30 y.o. yo [id number] at [redacted]w[redacted]d was admitted in Active Labor on 05/25/2021. Patient had an uncomplicated labor course as follows:  Membrane Rupture Time/Date: 10:33 PM ,05/25/2021   Delivery Method:Vaginal, Spontaneous  Episiotomy: None  Lacerations:  1st degree  Patient had an uncomplicated postpartum course.  She is ambulating, tolerating a regular diet, passing flatus, and urinating well. Patient is discharged home in stable condition on 05/28/21.  Newborn Data: Birth date:05/26/2021  Birth time:1:42 AM  Gender:Female  Living status:Living  Apgars:8 ,9  Weight:3756 g   Physical exam  Vitals:   05/27/21 0530 05/27/21 1306 05/27/21 1954 05/28/21 0542  BP: 115/70 119/66 119/73 120/90  Pulse: 72 89 93 72  Resp: 18 16 18 18   Temp: 98.5 F (36.9 C) 98.2 F (36.8 C) 98.1 F (36.7 C) 98.2 F (36.8 C)  TempSrc: Oral Oral Oral Oral  SpO2:  100% 100% 100%   General: alert, cooperative, and no distress Lochia: appropriate Uterine Fundus: firm Incision: N/A DVT Evaluation: No evidence of DVT seen on physical exam. Negative Homan's sign. No cords or calf tenderness. Labs: Lab Results  Component Value Date    WBC 8.8 05/27/2021   HGB 10.4 (L) 05/27/2021   HCT 31.0 (L) 05/27/2021   MCV 95.1 05/27/2021   PLT 140 (L) 05/27/2021   CMP Latest Ref Rng & Units 11/24/2018  Glucose 70 - 99 mg/dL 96  BUN 6 - 20 mg/dL 6  Creatinine 11/26/2018 - 0.63 mg/dL 0.16  Sodium 0.10 - 932 mmol/L 132(L)  Potassium 3.5 - 5.1 mmol/L 4.0  Chloride 98 - 111 mmol/L 100  CO2 22 - 32 mmol/L 22  Calcium 8.9 - 10.3 mg/dL 9.0  Total Protein 6.5 - 8.1 g/dL 7.2  Total Bilirubin 0.3 - 1.2 mg/dL 0.8  Alkaline Phos 38 - 126 U/L 50  AST 15 - 41 U/L 23  ALT 0 - 44 U/L 24   Edinburgh Score: Edinburgh Postnatal Depression Scale Screening Tool 05/27/2021  I have been able to laugh and see the funny side of things. 0  I have looked forward with enjoyment to things. 0  I have blamed myself unnecessarily when things went wrong. 1  I have been anxious or worried for no good reason. 2  I have felt scared or panicky for no good reason. 2  Things have been getting on top of me. 0  I have been so unhappy that I have had difficulty sleeping. 0  I have felt sad or miserable. 0  I have been so unhappy that I  have been crying. 0  The thought of harming myself has occurred to me. 0  Edinburgh Postnatal Depression Scale Total 5      After visit meds:  Allergies as of 05/28/2021   No Known Allergies      Medication List     TAKE these medications    calcium carbonate 1250 (500 Ca) MG chewable tablet Commonly known as: OS-CAL Chew 1 tablet by mouth daily.   ibuprofen 600 MG tablet Commonly known as: ADVIL Take 1 tablet (600 mg total) by mouth every 6 (six) hours as needed. What changed: Another medication with the same name was added. Make sure you understand how and when to take each.   ibuprofen 600 MG tablet Commonly known as: ADVIL Take 1 tablet (600 mg total) by mouth every 6 (six) hours. What changed: You were already taking a medication with the same name, and this prescription was added. Make sure you understand how  and when to take each.   multivitamin-prenatal 27-0.8 MG Tabs tablet Take 1 tablet by mouth daily at 12 noon.   predniSONE 1 MG tablet Commonly known as: DELTASONE Take 1 mg by mouth daily with breakfast.         Discharge home in stable condition Infant Feeding: Breast Infant Disposition:home with mother Discharge instruction: per After Visit Summary and Postpartum booklet. Activity: Advance as tolerated. Pelvic rest for 6 weeks.  Diet: routine diet Anticipated Birth Control: Unsure Postpartum Appointment:6 weeks  Follow up Visit:  Follow-up Information     Edwinna Areola, DO. Schedule an appointment as soon as possible for a visit in 6 week(s).   Specialty: Obstetrics and Gynecology Contact information: [address] [place] [address] [place] [address] [phone]                     05/28/2021 Carlisle Cater, MD

## 2021-06-08 ENCOUNTER — Telehealth (HOSPITAL_COMMUNITY): Payer: Self-pay

## 2021-06-08 NOTE — Telephone Encounter (Signed)
"  I'm doing well." Patient has no questions or concerns about her healing.  "He's well. He is back up to his birthweight and he is gaining weight well. He eats well. He sleeps in a bassinet."RN reviewed ABC's of safe sleep with patient. Patient declines any questions or concerns about baby.  EPDS score is 2.   Kaitlyn Nguyen [facility] 06/08/2021,1940

## 2024-01-03 ENCOUNTER — Other Ambulatory Visit: Payer: Self-pay | Admitting: Obstetrics

## 2024-01-03 ENCOUNTER — Other Ambulatory Visit (HOSPITAL_COMMUNITY): Payer: Self-pay | Admitting: Obstetrics

## 2024-01-03 ENCOUNTER — Other Ambulatory Visit: Payer: Self-pay | Admitting: Physician Assistant

## 2024-01-03 ENCOUNTER — Encounter (HOSPITAL_COMMUNITY): Payer: Self-pay | Admitting: Obstetrics

## 2024-01-03 DIAGNOSIS — O021 Missed abortion: Secondary | ICD-10-CM

## 2024-01-03 DIAGNOSIS — O3680X0 Pregnancy with inconclusive fetal viability, not applicable or unspecified: Secondary | ICD-10-CM

## 2024-01-03 HISTORY — DX: Missed abortion: O02.1

## 2024-01-03 NOTE — Progress Notes (Signed)
 Spoke w/ via phone for pre-op interview--- pt Lab needs dos----    no     Lab results------ no COVID test -----patient states asymptomatic no test needed Arrive at ------- 1045 on 01-04-2024 NPO after MN NO Solid Food.  Clear liquids from MN until--- 0945 Pre-Surgery Ensure or G2: n/a  Med rec completed Medications to take morning of surgery ----- none Diabetic medication ----- n/a  GLP1 agonist last dose: n/a GLP1 instructions:  Patient instructed no nail polish to be worn day of surgery Patient instructed to bring photo id and insurance card day of surgery Patient aware to have Driver (ride ) / caregiver    for 24 hours after surgery - husband, evans Patient Special Instructions ----- n/a Pre-Op special Instructions ----- case added on today,  pre-op orders pending  Patient verbalized understanding of instructions that were given at this phone interview. Patient denies chest pain, sob, fever, cough at the interview.

## 2024-01-04 ENCOUNTER — Encounter (HOSPITAL_COMMUNITY)
Admission: RE | Disposition: A | Discharge status: Home / Self Care | Payer: Self-pay | Source: Ambulatory Visit | Attending: Obstetrics

## 2024-01-04 ENCOUNTER — Other Ambulatory Visit: Payer: Self-pay

## 2024-01-04 ENCOUNTER — Ambulatory Visit (HOSPITAL_BASED_OUTPATIENT_CLINIC_OR_DEPARTMENT_OTHER): Admitting: Anesthesiology

## 2024-01-04 ENCOUNTER — Encounter (HOSPITAL_COMMUNITY): Payer: Self-pay | Admitting: Obstetrics

## 2024-01-04 ENCOUNTER — Ambulatory Visit (HOSPITAL_COMMUNITY)
Admission: RE | Admit: 2024-01-04 | Discharge: 2024-01-04 | Discharge status: Home / Self Care | Source: Ambulatory Visit | Attending: Obstetrics

## 2024-01-04 ENCOUNTER — Ambulatory Visit (HOSPITAL_COMMUNITY)
Admission: RE | Admit: 2024-01-04 | Discharge: 2024-01-04 | Disposition: A | Discharge status: Home / Self Care | Source: Ambulatory Visit | Attending: Obstetrics | Admitting: Obstetrics

## 2024-01-04 ENCOUNTER — Ambulatory Visit (HOSPITAL_COMMUNITY): Admitting: Anesthesiology

## 2024-01-04 ENCOUNTER — Other Ambulatory Visit (HOSPITAL_COMMUNITY): Payer: Self-pay | Admitting: Obstetrics

## 2024-01-04 DIAGNOSIS — O021 Missed abortion: Secondary | ICD-10-CM | POA: Diagnosis present

## 2024-01-04 DIAGNOSIS — Z3A18 18 weeks gestation of pregnancy: Secondary | ICD-10-CM | POA: Diagnosis not present

## 2024-01-04 DIAGNOSIS — Z01818 Encounter for other preprocedural examination: Secondary | ICD-10-CM

## 2024-01-04 DIAGNOSIS — O3680X0 Pregnancy with inconclusive fetal viability, not applicable or unspecified: Secondary | ICD-10-CM

## 2024-01-04 HISTORY — PX: OPERATIVE ULTRASOUND: SHX5996

## 2024-01-04 HISTORY — DX: Gastro-esophageal reflux disease without esophagitis: K21.9

## 2024-01-04 HISTORY — DX: Carpal tunnel syndrome, bilateral upper limbs: G56.03

## 2024-01-04 HISTORY — PX: DILATION AND EVACUATION: SHX1459

## 2024-01-04 LAB — TYPE AND SCREEN
ABO/RH(D): O POS
Antibody Screen: NEGATIVE

## 2024-01-04 SURGERY — DILATION AND EVACUATION, UTERUS, SECOND TRIMESTER
Anesthesia: General | Site: Uterus

## 2024-01-04 MED ORDER — CHLORHEXIDINE GLUCONATE 0.12 % MT SOLN
OROMUCOSAL | Status: AC
  Administered 2024-01-04: 15 mL
  Filled 2024-01-04: qty 15

## 2024-01-04 MED ORDER — HYDROMORPHONE HCL 1 MG/ML IJ SOLN: 0.2500 mg | INTRAMUSCULAR | Status: DC | PRN

## 2024-01-04 MED ORDER — LIDOCAINE 2% (20 MG/ML) 5 ML SYRINGE
INTRAMUSCULAR | Status: AC
  Filled 2024-01-04: qty 5

## 2024-01-04 MED ORDER — TRANEXAMIC ACID-NACL 1000-0.7 MG/100ML-% IV SOLN
INTRAVENOUS | Status: AC
  Filled 2024-01-04: qty 100

## 2024-01-04 MED ORDER — DEXMEDETOMIDINE HCL IN NACL 80 MCG/20ML IV SOLN
INTRAVENOUS | Status: DC | PRN
  Administered 2024-01-04 (×3): 8 ug via INTRAVENOUS
  Administered 2024-01-04: 12 ug via INTRAVENOUS

## 2024-01-04 MED ORDER — AMISULPRIDE (ANTIEMETIC) 5 MG/2ML IV SOLN: 10.0000 mg | Freq: Once | INTRAVENOUS | Status: DC | PRN

## 2024-01-04 MED ORDER — LIDOCAINE 2% (20 MG/ML) 5 ML SYRINGE
INTRAMUSCULAR | Status: DC | PRN
  Administered 2024-01-04: 60 mg via INTRAVENOUS

## 2024-01-04 MED ORDER — ONDANSETRON HCL 4 MG/2ML IJ SOLN
INTRAMUSCULAR | Status: AC
  Filled 2024-01-04: qty 2

## 2024-01-04 MED ORDER — OXYCODONE HCL 5 MG PO TABS: 5.0000 mg | ORAL_TABLET | Freq: Once | ORAL | Status: DC | PRN

## 2024-01-04 MED ORDER — PHENYLEPHRINE 80 MCG/ML (10ML) SYRINGE FOR IV PUSH (FOR BLOOD PRESSURE SUPPORT)
PREFILLED_SYRINGE | INTRAVENOUS | Status: DC | PRN
  Administered 2024-01-04 (×2): 160 ug via INTRAVENOUS

## 2024-01-04 MED ORDER — PROPOFOL 10 MG/ML IV BOLUS
INTRAVENOUS | Status: DC | PRN
  Administered 2024-01-04 (×3): 50 mg via INTRAVENOUS
  Administered 2024-01-04: 200 mg via INTRAVENOUS
  Administered 2024-01-04: 50 mg via INTRAVENOUS

## 2024-01-04 MED ORDER — SCOPOLAMINE 1 MG/3DAYS TD PT72
1.0000 | MEDICATED_PATCH | TRANSDERMAL | Status: DC
  Administered 2024-01-04: 1.5 mg via TRANSDERMAL

## 2024-01-04 MED ORDER — KETOROLAC TROMETHAMINE 30 MG/ML IJ SOLN: 30.0000 mg | Freq: Once | INTRAMUSCULAR | Status: DC | PRN

## 2024-01-04 MED ORDER — IBUPROFEN 200 MG PO TABS: 600.0000 mg | ORAL_TABLET | Freq: Four times a day (QID) | ORAL | 0 refills | Status: AC | PRN

## 2024-01-04 MED ORDER — MEPERIDINE HCL 25 MG/ML IJ SOLN: 6.2500 mg | INTRAMUSCULAR | Status: DC | PRN

## 2024-01-04 MED ORDER — MIDAZOLAM HCL 2 MG/2ML IJ SOLN
INTRAMUSCULAR | Status: AC
  Filled 2024-01-04: qty 2

## 2024-01-04 MED ORDER — ONDANSETRON HCL 4 MG/2ML IJ SOLN: 4.0000 mg | Freq: Once | INTRAMUSCULAR | Status: DC | PRN

## 2024-01-04 MED ORDER — DOXYCYCLINE HYCLATE 100 MG IV SOLR
200.0000 mg | Freq: Two times a day (BID) | INTRAVENOUS | Status: DC
  Administered 2024-01-04: 200 mg via INTRAVENOUS
  Filled 2024-01-04 (×2): qty 200

## 2024-01-04 MED ORDER — PROPOFOL 10 MG/ML IV BOLUS
INTRAVENOUS | Status: AC
  Filled 2024-01-04: qty 20

## 2024-01-04 MED ORDER — MISOPROSTOL 200 MCG PO TABS
ORAL_TABLET | ORAL | Status: AC
  Filled 2024-01-04: qty 5

## 2024-01-04 MED ORDER — FENTANYL CITRATE (PF) 250 MCG/5ML IJ SOLN
INTRAMUSCULAR | Status: AC
  Filled 2024-01-04: qty 5

## 2024-01-04 MED ORDER — KETOROLAC TROMETHAMINE 30 MG/ML IJ SOLN
INTRAMUSCULAR | Status: DC | PRN
  Administered 2024-01-04: 30 mg via INTRAVENOUS

## 2024-01-04 MED ORDER — LACTATED RINGERS IV SOLN: INTRAVENOUS | Status: DC

## 2024-01-04 MED ORDER — CHLORHEXIDINE GLUCONATE 0.12 % MT SOLN
15.0000 mL | Freq: Once | OROMUCOSAL | Status: AC
  Administered 2024-01-04: 15 mL via OROMUCOSAL

## 2024-01-04 MED ORDER — POVIDONE-IODINE 10 % EX SWAB: 2.0000 | Freq: Once | CUTANEOUS | Status: DC

## 2024-01-04 MED ORDER — CARBOPROST TROMETHAMINE 250 MCG/ML IM SOLN
INTRAMUSCULAR | Status: AC
  Filled 2024-01-04: qty 1

## 2024-01-04 MED ORDER — ONDANSETRON HCL 4 MG/2ML IJ SOLN
INTRAMUSCULAR | Status: DC | PRN
  Administered 2024-01-04: 4 mg via INTRAVENOUS

## 2024-01-04 MED ORDER — DEXMEDETOMIDINE HCL IN NACL 80 MCG/20ML IV SOLN
INTRAVENOUS | Status: AC
  Filled 2024-01-04: qty 20

## 2024-01-04 MED ORDER — SCOPOLAMINE 1 MG/3DAYS TD PT72
MEDICATED_PATCH | TRANSDERMAL | Status: AC
  Filled 2024-01-04: qty 1

## 2024-01-04 MED ORDER — MIDAZOLAM HCL 2 MG/2ML IJ SOLN
INTRAMUSCULAR | Status: DC | PRN
  Administered 2024-01-04: 2 mg via INTRAVENOUS

## 2024-01-04 MED ORDER — ACETAMINOPHEN 500 MG PO TABS
1000.0000 mg | ORAL_TABLET | Freq: Once | ORAL | Status: AC
  Administered 2024-01-04: 1000 mg via ORAL

## 2024-01-04 MED ORDER — ORAL CARE MOUTH RINSE: 15.0000 mL | Freq: Once | OROMUCOSAL | Status: AC

## 2024-01-04 MED ORDER — METHYLERGONOVINE MALEATE 0.2 MG/ML IJ SOLN
INTRAMUSCULAR | Status: AC
  Filled 2024-01-04: qty 1

## 2024-01-04 MED ORDER — CHLOROPROCAINE HCL 1 % IJ SOLN
INTRAMUSCULAR | Status: DC | PRN
  Administered 2024-01-04: 20 mL

## 2024-01-04 MED ORDER — CHLOROPROCAINE HCL 1 % IJ SOLN
INTRAMUSCULAR | Status: AC
  Filled 2024-01-04: qty 30

## 2024-01-04 MED ORDER — FENTANYL CITRATE (PF) 250 MCG/5ML IJ SOLN
INTRAMUSCULAR | Status: DC | PRN
  Administered 2024-01-04: 50 ug via INTRAVENOUS

## 2024-01-04 MED ORDER — DEXAMETHASONE SODIUM PHOSPHATE 10 MG/ML IJ SOLN
INTRAMUSCULAR | Status: AC
  Filled 2024-01-04: qty 1

## 2024-01-04 MED ORDER — OXYCODONE HCL 5 MG/5ML PO SOLN: 5.0000 mg | Freq: Once | ORAL | Status: DC | PRN

## 2024-01-04 MED ORDER — ACETAMINOPHEN 500 MG PO TABS
ORAL_TABLET | ORAL | Status: AC
  Filled 2024-01-04: qty 2

## 2024-01-04 MED ORDER — PROPOFOL 500 MG/50ML IV EMUL
INTRAVENOUS | Status: DC | PRN
  Administered 2024-01-04: 100 ug/kg/min via INTRAVENOUS

## 2024-01-04 MED ORDER — DEXAMETHASONE SODIUM PHOSPHATE 10 MG/ML IJ SOLN
INTRAMUSCULAR | Status: DC | PRN
  Administered 2024-01-04: 10 mg via INTRAVENOUS

## 2024-01-04 SURGICAL SUPPLY — 20 items
CATH ROBINSON RED A/P 16FR (CATHETERS) ×3 IMPLANT
FILTER UTR ASPR ASSEMBLY (MISCELLANEOUS) ×3 IMPLANT
GLOVE BIO SURGEON STRL SZ7 (GLOVE) ×3 IMPLANT
GLOVE BIOGEL PI IND STRL 7.0 (GLOVE) ×6 IMPLANT
GOWN STRL REUS W/ TWL LRG LVL3 (GOWN DISPOSABLE) ×6 IMPLANT
HOSE CONNECTING 18IN BERKELEY (TUBING) ×3 IMPLANT
KIT BERKELEY 1ST TRI 3/8 NO TR (MISCELLANEOUS) ×3 IMPLANT
KIT BERKELEY 1ST TRIMESTER 3/8 (MISCELLANEOUS) ×3 IMPLANT
NS IRRIG 1000ML POUR BTL (IV SOLUTION) ×3 IMPLANT
PACK VAGINAL MINOR WOMEN LF (CUSTOM PROCEDURE TRAY) ×3 IMPLANT
PAD OB MATERNITY 11 LF (PERSONAL CARE ITEMS) ×3 IMPLANT
SET BERKELEY SUCTION TUBING (SUCTIONS) ×3 IMPLANT
SPIKE FLUID TRANSFER (MISCELLANEOUS) ×3 IMPLANT
TOWEL GREEN STERILE FF (TOWEL DISPOSABLE) ×6 IMPLANT
TUBE VACURETTE 2ND TRIMESTER (CANNULA) ×3 IMPLANT
UNDERPAD 30X36 HEAVY ABSORB (UNDERPADS AND DIAPERS) ×3 IMPLANT
VACURETTE 10 RIGID CVD (CANNULA) IMPLANT
VACURETTE 12 RIGID CVD (CANNULA) IMPLANT
VACURETTE 14MM CVD 1/2 BASE (CANNULA) IMPLANT
VACURETTE 16MM ASPIR CVD .5 (CANNULA) IMPLANT

## 2024-01-04 NOTE — Transfer of Care (Signed)
 Immediate Anesthesia Transfer of Care Note  Patient: Kaitlyn Nguyen  Procedure(s) Performed: DILATION AND EVACUATION, UTERUS, SECOND TRIMESTER (Uterus) [place] INTRAOPERATIVE (Abdomen)  Patient Location: PACU  Anesthesia Type:General  Level of Consciousness: drowsy, patient cooperative, and responds to stimulation  Airway & Oxygen Therapy: Patient Spontanous Breathing and Patient connected to face mask oxygen  Post-op Assessment: Report given to RN, Post -op Vital signs reviewed and stable, and Patient moving all extremities X 4  Post vital signs: Reviewed and stable  Last Vitals:  Vitals Value Taken Time  BP 86/57 01/04/24 1315  Temp 37 C 01/04/24 1315  Pulse 74 01/04/24 1320  Resp 19 01/04/24 1320  SpO2 98 % 01/04/24 1320  Vitals shown include unfiled device data.  Last Pain:  Vitals:   01/04/24 1315  TempSrc:   PainSc: Asleep         Complications: No notable events documented.

## 2024-01-04 NOTE — Anesthesia Procedure Notes (Signed)
 Procedure Name: LMA Insertion Date/Time: 01/04/2024 12:39 PM  Performed by: Shary Decamp, CRNAPre-anesthesia Checklist: Patient identified, Emergency Drugs available, Suction available and Patient being monitored Patient Re-evaluated:Patient Re-evaluated prior to induction Oxygen Delivery Method: Circle system utilized Preoxygenation: Pre-oxygenation with 100% oxygen Induction Type: IV induction Ventilation: Mask ventilation without difficulty LMA: LMA flexible inserted LMA Size: 4.0 Number of attempts: 1 Placement Confirmation: positive ETCO2 and breath sounds checked- equal and bilateral Tube secured with: Tape Dental Injury: Teeth and Oropharynx as per pre-operative assessment

## 2024-01-04 NOTE — Discharge Instructions (Addendum)
 DO NOT TAKE MOTRIN UNTIL AFTER 7PM.  DO NOT TAKE TYLENOL UNTIL AFTER 5:30PM.     Post-Operative Discharge Instructions  Surgery: Dilation and Evacuation Surgeon: Marlene Bast, MD  Please follow the instructions below during your recovery period. If you have questions or concerns please call our office at [phone].  Please look out for the following and call the office immediately or go to nearest Emergency Room if you experience: - Chest pain - Shortness of breath - Fever >100.4 F  - Persistent nausea and/or vomiting - Difficulty urinating or not urinating for >6 hours  - Severe abdominal or pelvic pain not responsive to recommended pain medications - Heavy vaginal bleeding (soaking >2 pads per hour)  - Pain, swelling, and/or redness in one leg   Activity: You may resume normal activity as tolerated. We recommend walking as soon and as much as you are comfortably able.  Please do not put in anything in the vagina for 2 weeks after your surgery. This includes: - No intercourse - No tampons - No douching - No sitting in water (e.g. pools, baths, etc.)   Expectations: - Light vaginal bleeding is normal after your procedure and should resolve in the next few days. - Some abdominal soreness and bloating is normal. Please see below for recommended medications.  - Feeling more tired than usual for a few days following anesthesia is normal.   Diet: You may resume normal diet immediately after surgery. Anesthesia can make you feel nauseous, we recommend avoiding spicy or acidic foods for the first 72 hours after surgery. Drink lots of water (64oz/day).   Pain: We expect you to have some abdominal/pelvic pain and cramping for a few days after your surgery. We recommend taking medication around the clock for the first 48-72 hours after your surgery as follows: - Acetaminophen (Tylenol) 1000mg  every 6 hours  - Ibuprofen (Advil or Motrin) 600mg  every 6 hours - Miralax or other stool  softener to keep bowel movements soft and avoid straining   We wish you a speedy recovery!   Post Anesthesia Home Care Instructions  Activity: Get plenty of rest for the remainder of the day. A responsible adult should stay with you for 24 hours following the procedure.  For the next 24 hours, DO NOT: -Drive a car -Advertising copywriter -Drink alcoholic beverages -Take any medication unless instructed by your physician -Make any legal decisions or sign important papers.  Meals: Start with liquid foods such as gelatin or soup. Progress to regular foods as tolerated. Avoid greasy, spicy, heavy foods. If nausea and/or vomiting occur, drink only clear liquids until the nausea and/or vomiting subsides. Call your physician if vomiting continues.  Special Instructions/Symptoms: Your throat may feel dry or sore from the anesthesia or the breathing tube placed in your throat during surgery. If this causes discomfort, gargle with warm salt water. The discomfort should disappear within 24 hours.  If you had a scopolamine patch placed behind your ear for the management of post- operative nausea and/or vomiting:  1. The medication in the patch is effective for 72 hours, after which it should be removed.  Wrap patch in a tissue and discard in the trash. Wash hands thoroughly with soap and water. 2. You may remove the patch earlier than 72 hours if you experience unpleasant side effects which may include dry mouth, dizziness or visual disturbances. 3. Avoid touching the patch. Wash your hands with soap and water after contact with the patch.  Call your surgeon  if you experience:   1.  Fever over 101.0. 2.  Inability to urinate. 3.  Nausea and/or vomiting. 4.  Extreme swelling or bruising at the surgical site. 5.  Continued bleeding from the incision. 6.  Increased pain, redness or drainage from the incision. 7.  Problems related to your pain medication. 8. Any change in color, movement and/or  sensation 9. Any problems and/or concerns

## 2024-01-04 NOTE — Anesthesia Preprocedure Evaluation (Signed)
 Anesthesia Evaluation  Patient identified by MRN, date of birth, ID band Patient awake    Reviewed: Allergy & Precautions, NPO status , Patient's Chart, lab work & pertinent test results  Airway Mallampati: II  TM Distance: >3 FB Neck ROM: Full    Dental  (+) Teeth Intact, Dental Advisory Given   Pulmonary neg pulmonary ROS   Pulmonary exam normal breath sounds clear to auscultation       Cardiovascular negative cardio ROS Normal cardiovascular exam Rhythm:Regular Rate:Normal     Neuro/Psych  PSYCHIATRIC DISORDERS Anxiety     negative neurological ROS     GI/Hepatic Neg liver ROS,GERD  Controlled,,  Endo/Other  negative endocrine ROS    Renal/GU negative Renal ROS  negative genitourinary   Musculoskeletal negative musculoskeletal ROS (+)    Abdominal   Peds  Hematology negative hematology ROS (+)   Anesthesia Other Findings   Reproductive/Obstetrics 15 weeks SAB                             Anesthesia Physical Anesthesia Plan  ASA: 1  Anesthesia Plan: General   Post-op Pain Management: Tylenol PO (pre-op)*, Toradol IV (intra-op)* and Precedex   Induction: Intravenous  PONV Risk Score and Plan: 4 or greater and Ondansetron, Dexamethasone, Treatment may vary due to age or medical condition and Midazolam  Airway Management Planned: LMA  Additional Equipment: None  Intra-op Plan:   Post-operative Plan: Extubation in OR  Informed Consent: I have reviewed the patients History and Physical, chart, labs and discussed the procedure including the risks, benefits and alternatives for the proposed anesthesia with the patient or authorized representative who has indicated his/her understanding and acceptance.     Dental advisory given  Plan Discussed with: CRNA  Anesthesia Plan Comments:        Anesthesia Quick Evaluation

## 2024-01-04 NOTE — H&P (Signed)
 Surgical H&P  S: 33yo G3P2002 at [redacted]w[redacted]d with newly diagnosed IUFD on ultrasound today. Patient seen in office this morning for CUS and fetal demise diagnosed at that time. Having light vaginal bleeding for last week. Denies abdominal pain, fevers/chills, or abnormal vaginal discharge.    Past Medical History:  Diagnosis Date   Anxiety    Carpal tunnel syndrome, bilateral    per pt left > right   GERD (gastroesophageal reflux disease)    Missed abortion with fetal demise before 20 completed weeks of gestation 01/03/2024   15 weeks intrauterine fetal demise   Past Surgical History:  Procedure Laterality Date   NO PAST SURGERIES     Current Outpatient Medications  Medication Instructions   acetaminophen (TYLENOL) 1,000 mg, Every 6 hours PRN   calcium carbonate (TUMS - DOSED IN MG ELEMENTAL CALCIUM) 500 MG chewable tablet 1 tablet, As needed   ibuprofen (ADVIL) 200 mg, Every 6 hours PRN    No Known Allergies   O:  VS  Wt 181.6   Ht 5'8"   BMI  27.6    BP 129/96  PE: GA: well appearing, NAD Chest: normal work of breathing on room air Abd: soft, non-tender, gravid Ext: no signs of DVT Pelvic: normal appearing vagina and cervix, two laminaria placed and gauze sponge placed in vagina.   Labs: ABO/RH: O positive   Imaging:  OBUS 01/03/24 10a: Fetus measuring 15w with no fetal heart tones- c/w fetal demise. Posterior placenta.  A/P: 35XW [id number] at [redacted]w[redacted]d with newly diagnosed IUFD on ultrasound today, plan for D&E. S/p laminaria placement - 2 laminaria, gauze x1 placed in vagina. Plan 200mg  IV doxycycline intraoperatively. Risks of procedure, including but not limited to pain, bleeding, and damage to surrounding organs (specifically uterus and cervix) reviewed.   Plan to proceed to OR when ready.  Marlene Bast, MD

## 2024-01-04 NOTE — Op Note (Signed)
 Operative Report  Surgeon: Edger House, MD  Assistant: None  Preoperative Diagnosis: Fetal demise at 18w  Postoperative Diagnosis: Same  Anesthesia: General  Implants: None  Specimen: Products of conception  Drains: None  Fluids: Per anesthesia record  EBL: 200 mL   Complications: None  Findings: EUA: Normal external female genitalia. Normal appearing vagina and cervix. 2 laminaria and 1 gauze sponge removed from vagina. 2cm dilated at start of case. Intra-op: Ultrasound guidance used for procedure. Fetal tissue removed and all aspects identified- calvarium, thorax, abdomen, lower extremity x2, upper extremity x2 and placenta. Ultrasound at end of case showed thin endometrial echo. Minimal oozing from cervix at end of case. Uterus firm with bimanual massage.   Procedure in Detail:  The patient was seen in the preoperative holding area, consents were verified and all questions and concerns related to the proposed procedure were discussed in detail. The patient was transferred to the operating room where SCD's were placed. The patient underwent general anesthesia without complication. The patient was placed in dorsal lithotomy position and prepped and draped in usual sterile fashion. A time out was performed with all team members.    Laminaria and gauze sponges were removed from vagina and cervix found to be 2cm dilated. Paracervical injections of 20cc nesacaine 1% injected at 10 and 2 o'clock. Anterior cervical lip grasped with ring forceps and cervix serially dilated up to #53 [place]. Under ultrasound guidance, bieres forceps used to grasp fetal tissue and remove with gentle traction. A 10mm canula was used to remove any remaining tissue with suction curette under ultrasound guidance. Two passes were made with minimal blood on second pass. Ultrasound at end of case showed thin endometrium.   All instruments were removed. The cervix and uterus were massaged bimanually until a gauze  in the vagina came out clean. Cervix was inspected and hemostasis ensured. All sponge and instrument counts correct x2.    Patient was awakened from anesthesia and taken to the recovery room in stable condition.   Marlene Bast, MD

## 2024-01-04 NOTE — Interval H&P Note (Signed)
 History and Physical Interval Note:  01/04/2024 12:08 PM  Kaitlyn Nguyen  has presented today for surgery, with the diagnosis of 15 week Intrauterine Fetal Demise.  The various methods of treatment have been discussed with the patient and family. After consideration of risks, benefits and other options for treatment, the patient has consented to  Procedure(s): DILATION AND EVACUATION, UTERUS, SECOND TRIMESTER (N/A) [place] INTRAOPERATIVE (N/A) as a surgical intervention.  The patient's history has been reviewed, patient examined, no change in status, stable for surgery.  I have reviewed the patient's chart and labs.  Questions were answered to the patient's satisfaction.     Antony Salmon D'iorio

## 2024-01-05 ENCOUNTER — Encounter (HOSPITAL_COMMUNITY): Payer: Self-pay | Admitting: Obstetrics

## 2024-01-05 LAB — SURGICAL PATHOLOGY

## 2024-01-05 NOTE — Anesthesia Postprocedure Evaluation (Signed)
 Anesthesia Post Note  Patient: Amberli Ruegg  Procedure(s) Performed: DILATION AND EVACUATION, UTERUS, SECOND TRIMESTER (Uterus) [place] INTRAOPERATIVE (Abdomen)     Patient location during evaluation: PACU Anesthesia Type: General Level of consciousness: awake and alert, oriented and patient cooperative Pain management: pain level controlled Vital Signs Assessment: post-procedure vital signs reviewed and stable Respiratory status: spontaneous breathing, nonlabored ventilation and respiratory function stable Cardiovascular status: blood pressure returned to baseline and stable Postop Assessment: no apparent nausea or vomiting Anesthetic complications: no   No notable events documented.  Last Vitals:  Vitals:   01/04/24 1119 01/04/24 1315  BP: (!) 140/95 (!) 86/57  Pulse: (!) 113 81  Resp: 18 18  Temp: 37.3 C 37 C  SpO2: 99% 98%    Last Pain:  Vitals:   01/04/24 1315  TempSrc:   PainSc: Asleep   Pain Goal:                   Lannie Fields
